# Patient Record
Sex: Female | Born: 1945 | Race: White | Hispanic: No | Marital: Married | Smoking: Never smoker
Health system: Southern US, Community
[De-identification: ages and names within clinical notes are randomized; demographics above are authoritative.]

## PROBLEM LIST (undated history)

## (undated) DIAGNOSIS — I10 Essential (primary) hypertension: Secondary | ICD-10-CM

## (undated) DIAGNOSIS — E079 Disorder of thyroid, unspecified: Secondary | ICD-10-CM

---

## 2001-01-17 ENCOUNTER — Encounter: Admission: RE | Admit: 2001-01-17 | Discharge: 2001-01-17 | Payer: Self-pay | Admitting: Family Medicine

## 2001-01-17 ENCOUNTER — Encounter: Payer: Self-pay | Admitting: Family Medicine

## 2001-02-01 ENCOUNTER — Encounter: Payer: Self-pay | Admitting: Family Medicine

## 2001-02-01 ENCOUNTER — Encounter: Admission: RE | Admit: 2001-02-01 | Discharge: 2001-02-01 | Payer: Self-pay | Admitting: Family Medicine

## 2003-04-23 ENCOUNTER — Encounter: Admission: RE | Admit: 2003-04-23 | Discharge: 2003-04-23 | Payer: Self-pay | Admitting: Family Medicine

## 2003-04-23 ENCOUNTER — Encounter: Payer: Self-pay | Admitting: Family Medicine

## 2005-05-24 ENCOUNTER — Ambulatory Visit: Payer: Self-pay | Admitting: Infectious Diseases

## 2005-05-25 ENCOUNTER — Ambulatory Visit: Payer: Self-pay | Admitting: Infectious Diseases

## 2005-05-25 ENCOUNTER — Ambulatory Visit (HOSPITAL_COMMUNITY): Admission: RE | Admit: 2005-05-25 | Discharge: 2005-05-25 | Payer: Self-pay | Admitting: Infectious Diseases

## 2005-05-30 ENCOUNTER — Ambulatory Visit: Payer: Self-pay | Admitting: Infectious Diseases

## 2005-06-01 ENCOUNTER — Ambulatory Visit: Payer: Self-pay | Admitting: Infectious Diseases

## 2005-06-05 ENCOUNTER — Ambulatory Visit: Payer: Self-pay | Admitting: Infectious Diseases

## 2005-06-06 ENCOUNTER — Ambulatory Visit: Payer: Self-pay | Admitting: Internal Medicine

## 2005-06-06 ENCOUNTER — Inpatient Hospital Stay (HOSPITAL_COMMUNITY): Admission: EM | Admit: 2005-06-06 | Discharge: 2005-06-13 | Payer: Self-pay | Admitting: Emergency Medicine

## 2005-06-07 ENCOUNTER — Ambulatory Visit: Payer: Self-pay | Admitting: Oncology

## 2005-06-09 ENCOUNTER — Encounter (INDEPENDENT_AMBULATORY_CARE_PROVIDER_SITE_OTHER): Payer: Self-pay | Admitting: Specialist

## 2005-06-30 ENCOUNTER — Encounter: Admission: RE | Admit: 2005-06-30 | Discharge: 2005-06-30 | Payer: Self-pay | Admitting: Gastroenterology

## 2008-04-24 ENCOUNTER — Inpatient Hospital Stay (HOSPITAL_COMMUNITY): Admission: AD | Admit: 2008-04-24 | Discharge: 2008-04-27 | Payer: Self-pay | Admitting: Family Medicine

## 2009-11-05 ENCOUNTER — Encounter: Admission: RE | Admit: 2009-11-05 | Discharge: 2009-12-08 | Payer: Self-pay | Admitting: Rheumatology

## 2010-08-24 ENCOUNTER — Encounter
Admission: RE | Admit: 2010-08-24 | Discharge: 2010-08-24 | Payer: Self-pay | Source: Home / Self Care | Attending: Family Medicine | Admitting: Family Medicine

## 2010-12-27 NOTE — Discharge Summary (Signed)
[name], [name]                ACCOUNT NO.:  [id number]   MEDICAL RECORD NO.:  [id number]          PATIENT TYPE:  INP   LOCATION:  1408                         FACILITY:  [facility]   PHYSICIAN:  Kela Millin, M.D.DATE OF BIRTH:  1945-12-11   DATE OF ADMISSION:  04/24/2008  DATE OF DISCHARGE:                               DISCHARGE SUMMARY   DISCHARGE DIAGNOSES:  1. Gastroenteritis - likely viral, stool cultures negative.  2. Urinary tract infection.  3. Sepsis syndrome - secondary to above, clinically improved.  4. Hypokalemia - potassium replaced.  5. History of polymyalgia rheumatica.  6. History of hypertension.  7. History of hypothyroidism.  8. History of dyslipidemia.  9. History of seasonal allergies.  10.History of hypermetabolic lymph nodes - per PET scan in the past      with outpatient follow-up.  11.Anemia - hemodilution a contributing factor, no evidence of      gastrointestinal bleeding.  The patient to follow up with primary      care physician for recheck of hemoglobin outpatient and further      evaluation.  12.Acute renal failure - resolved.   BRIEF HISTORY:  The patient is a 65 year old white female with the above-  listed medical problems who presented with complaints of diarrhea with  nausea and vomiting and was also found to be orthostatic at the primary  care physician's office.  She reported that she had been having multiple  episodes of nonbloody, watery diarrhea for 3 days and also had been  having nausea and vomiting.  She admitted to subjective fevers.  She  denied melena and no hematochezia.  She was initially seen at her  primary care physician's office and was found to be orthostatic and so  was directly admitted to the hospital for further evaluation and  management.  Upon arrival to the hospital, the patient was found to be  hypotensive with a blood pressure of 79/47 initially.  Please see the  full dictated admission history and physical  for the details of the  admission physical exam as well as the laboratory data.   HOSPITAL COURSE:  1. Gastroenteritis - as discussed above, upon admission, the patient      was bolused with IV fluids and started on sips of clears.  Stool      studies were obtained.  She was empirically started on Florastor      and Questran.  The stool studies came back negative for clostridium      difficile x3, also otherwise no gross in the stool cultures.  With      these interventions, the patient's symptoms gradually began to      resolve.  Her blood pressures improved, nausea and vomiting      resolved, and her diet was advanced to solids.  She has been      tolerating her diet well.  She is hemodynamically stable and      afebrile.  She is anxious to go home, and with the above clinical      improvement including that her stools are more formed,  she will be      discharged home to follow up with her primary care physician.  2. Urinary tract infection - the patient had a urinalysis done upon      admission which was consistent with a UTI.  Urine cultures were      ordered, but unfortunately sample was only obtained after the      patient had been on antibiotics for a few days, and the results are      still pending at this time.  The patient has defervesced.  She had      a leukocytosis that peaked at 17 while in the hospital but has      improved significantly; her white cell count today prior to      discharge is 12.2.  She is hemodynamically stable and will be      discharged home on oral antibiotics.  3. Sepsis syndrome - secondary to above.  Blood cultures were also      obtained while the patient was in the hospital and no growth to      date.  With the management discussed above, the patient's      hypotension resolved; she also defervesced and is clinically      improved.  4. Acute renal failure - pre renal; the patient's creatinine on      admission was elevated as 4.53.  She was  bolused with IV fluids and      adequately hydrated during her hospital stay.  With this as well as      treatment of her urine infection, her hypotension resolved.  Also,      her renal failure resolved.  Her last creatinine today prior to      discharge is 0.99 with a BUN of 5.  5. Anemia - the patient's hemoglobin upon admission was noted to be      10.3 with a hematocrit of 30.6, and with hydration, it came down to      8.4.  The patient has been asymptomatic without any evidence of GI      bleeding.  She states that she had a colonoscopy 5 years ago, with      no abnormalities reported.  She has been instructed to follow up      with her primary care physician for recheck of her hemoglobin and      referral to outpatient GI as appropriate pending repeat labs.  6. Hypertension - because the patient was hypotensive on admission,      her antihypertensives were held.  The hypotension has resolved at      this time, and the patient is to resume her blood pressure      medications as directed.  7. History of PMR - the patient was maintained on prednisone during      her hospital stay; she is to continue her outpatient medications      upon discharge.  8. Hypothyroidism - she was maintained on her levothyroxine during her      hospital stay.   DISCHARGE MEDICATIONS:  1. Ceftin 500 mg p.o. b.i.d. for 1 more week.  2. Florastor 250 mg p.o. b.i.d. for 2 more weeks.  3. Imodium p.r.n.  4. The patient to continue methotrexate, Zocor, Synthroid, folic acid,      prednisone, calcium plus D as previously.  5. Benicar as directed.   FOLLOW-UP CARE:  Dr. Catha Gosselin in 1 week, call for appointment.  To  have  recheck CBC upon follow-up.   DISCHARGE CONDITION:  Improved/stable.      Kela Millin, M.D.  Electronically Signed     ACV/MEDQ  D:  04/27/2008  T:  04/27/2008  Job:  [id number]   cc:   Caryn Bee L. Little, M.D.  Fax: [phone]

## 2010-12-27 NOTE — H&P (Signed)
[name], [name]                ACCOUNT NO.:  [id number]   MEDICAL RECORD NO.:  [id number]          PATIENT TYPE:  INP   LOCATION:  1408                         FACILITY:  [facility]   PHYSICIAN:  Kela Millin, M.D.DATE OF BIRTH:  [birth year]   DATE OF ADMISSION:  04/24/2008  DATE OF DISCHARGE:                              HISTORY & PHYSICAL   PRIMARY CARE PHYSICIANS:  Dwana Curd. Para March, M.D./Kevin L. Little, M.D.   CHIEF COMPLAINT:  Diarrhea with nausea and vomiting as per primary care  physician, orthostatic at the office.   HISTORY OF PRESENT ILLNESS:  The patient is a 65 year old white female  with past medical history significant for polymyalgia rheumatica, on  chronic prednisone, hypertension, hypothyroidism, dyslipidemia, who  presents with the above complaints.  She states that she has been having  diarrhea x3 days, multiple episodes of watery, nonbloody stools.  She  admits to associated nausea and vomiting 3-4 times a day, also  nonbloody.  She admits to subjective fevers.  She states that she had  grilled chicken from [facility] on Tuesday prior to the onset of the diarrhea  and so did other members of her family, but she is the only one that has  this diarrheal illness.  No recent antibiotic use reported.  She denies  abdominal pain, dysuria, cough, chest pain, shortness of breath, melena,  and no hematochezia.   She went to her primary care physician's office today and was found to  be orthostatic with a systolic blood pressure in the 90s and was  directly admitted for further evaluation and management.  She states  that she did not take any of her antihypertensives today because she  felt like she might throw them up.   PAST MEDICAL HISTORY:  1. As above.  2. History of seasonal allergies.  3. History of hypermetabolic lymph nodes in the past followed by her      outpatient physicians.  4. History of leukocytosis.  5. History of seasonal allergies.   MEDICATIONS:  1. Benicar 20 mg daily.  2. Methotrexate 25 mg/mL subcu on Thursdays.  3. Simvastatin 40 mg daily.  4. Omega-3/fish oil 2 tablets b.i.d.  5. Synthroid 125 mcg daily.  6. Folic acid 1 mg daily.  7. Prednisone 5 mg q.a.m.  8. Calcium 600 plus D 2-1/2 tablets daily.   ALLERGIES:  No known drug allergies.   SOCIAL HISTORY:  She denies tobacco.  She also denies alcohol.   FAMILY HISTORY:  Her mother had diabetes.   REVIEW OF SYSTEMS:  As per HPI, otherwise negative.   PHYSICAL EXAM:  GENERAL:  The patient is a pleasant, obese, older white  female.  She is alert and oriented, in no apparent distress.  VITAL SIGNS:  Temperature 102.7 with a pulse of 104 initially and 90 on  recheck.  Blood pressure initially 79/47 and 86/54 on recheck, O2 sat  97% on room air.  HEENT:  PERRL, EOMI, sclerae anicteric, dry mucous membranes and no oral  exudates.  NECK:  Supple, no adenopathy, no thyromegaly and no JVD.  LUNGS:  Decreased  breath sounds at the bases, otherwise clear to  auscultation.  CARDIOVASCULAR:  Regular rate and rhythm.  Normal S1 and S2.  ABDOMEN:  Obese, soft, bowel sounds present, nontender, nondistended.  No organomegaly and no masses palpable.  EXTREMITIES:  No cyanosis and no edema.  NEUROLOGIC:  Alert and oriented x3.  Cranial nerves II-XII grossly  intact.  Nonfocal exam.   LABORATORY DATA:  The patient directly admitted and labs ordered are  pending at the time of this dictation.   ASSESSMENT AND PLAN:  1. Probable gastroenteritis:  Bacterial versus viral in this patient      presenting with nausea, vomiting and diarrhea.  Will obtain stool      cultures, supportive care.  Also, as noted, patient febrile.  We      will obtain blood cultures as well and follow.  2. Hypotension:  Likely secondary to hypokalemia due to #1.  Blood      cultures, stool studies as above.  Also EKG, H&H, follow and      further manage as appropriate pending the studies.  Volume       resuscitation as above and follow.  3. Polymyalgia rheumatica:  On chronic prednisone and methotrexate.      Give stress dose of hydrocortisone, follow and further manage as      appropriate pending clinical course.  4. History of hypertension:  Holding all antihypertensives at this      time.  5. Hypothyroidism:  Continue outpatient medications.  6. Dyslipidemia:  Continue outpatient medications.      Kela Millin, M.D.  Electronically Signed     ACV/MEDQ  D:  04/24/2008  T:  04/24/2008  Job:  [id number]   cc:   Dwana Curd. Para March, M.D.  Fax: [phone]   Anna Genre. Little, M.D.  Fax: [phone]

## 2010-12-30 NOTE — Consult Note (Signed)
[name], [name]                ACCOUNT NO.:  [id number]   MEDICAL RECORD NO.:  [id number]          PATIENT TYPE:  INP   LOCATION:  1605                         FACILITY:  [facility]   PHYSICIAN:  Wilmon Arms. Corliss Skains, M.D. DATE OF BIRTH:  [birth year]   DATE OF CONSULTATION:  06/10/2005  DATE OF DISCHARGE:                                   CONSULTATION   CONSULTING PHYSICIAN:  Dr. Virginia Rochester   REASON FOR CONSULTATION:  Possible lymph node biopsy.   [name] is a 65 year old female with previously good health, who presents  with 5 week history of intermittent fevers.  These fevers tend to occur at  night and have gone as high as 102.5.  She associated these fevers with  night sweats.  She has also had myalgias and arthralgias.  She describes  increased fatigue.  The patient has undergone a very extensive work-up by  her primary care physician, Dr. Clarene Duke, as well as Dr. Roxan Hockey in  infectious disease.  She was admitted to the hospital on 23 October for  further work-up.  She has had 2 abdominal CT scans with contrast which  showed no evidence of intra-abdominal pathology.  She had no evidence of any  lymphadenopathy.  The patient did report some nausea, vomiting, and diarrhea  several days ago, but this has since resolved.  She continues to have fevers  which occur mostly at night.  On October 26, she underwent a full body FDG  PET scan.  This was significant for multiple moderately hypermetabolic lymph  nodes in the upper abdomen, predominantly located near the hepatoduodenal  ligament and in the portacaval space.  She has some retroperitoneal  periaortic lymph nodes.  There is no abnormal activity above the diaphragm.  The patient underwent a bone marrow biopsy on October 27, from the left  iliac crest.  The patient tolerated the procedure well.  The results are  pending.  The patient continues to have fevers.  This morning she is at  afebrile but at 2200 last night she had a  temperature of 102.3.   CURRENT MEDICATIONS:  Synthroid, Imodium, Rocephin which is being stopped,  p.r.n. Tylenol, p.r.n. Phenergan.   HOME MEDICATIONS:  Synthroid.  She formerly took Merchant navy officer, Tourist information centre manager, Careers adviser  but is no longer taking these.   ALLERGIES:  None.   PAST MEDICAL HISTORY:  1.  Dyslipidemia.  2.  Hypertension.  3.  Hypothyroidism.  4.  Seasonal allergies.   PAST SURGICAL HISTORY:  None.   FAMILY HISTORY:  Coronary artery disease.  No history of any malignancies in  the immediate family, including leukemias or lymphomas.   PHYSICAL EXAMINATION:  VITAL SIGNS:  Temperature 97.5, pulse 80,  respirations 18, blood pressure 90/50.  GENERAL:  This is a well-developed, well-nourished female, who looks  fatigued but is in no acute distress.  HEENT:  Extraocular movements are intact.  Sclerae show no icterus.  NECK:  No lymphadenopathy, supple.  HEART:  Regular rate and rhythm.  No murmurs.  LUNGS:  Clear to auscultation bilaterally.  Normal respiratory effort.  ABDOMEN:  Soft,  nontender, nondistended.  No masses are palpated.   LABORATORY DATA:  Her C. difficile stool tests have been negative.  The bone  marrow culture is negative.  Her white count on October 27 was 17.9,  hemoglobin 8.4, platelet count elevated at 575.  Electrolytes are within  normal limits.  Liver function tests are essentially normal.   IMPRESSION:  Fever of unknown origin with PET scan showing hypometabolic  lymph nodes in the upper abdomen.  CT scan shows no evidence of  lymphadenopathy.  Bone marrow biopsy pending.   RECOMMENDATIONS:  This is a perplexing case.  Certainly lymphoma would be  high on the differential diagnosis.  An intra-abdominal lymph node biopsy on  this patient would be moderately difficult due to the fact that she has no  enlarged lymph nodes.  We could attempt a laparoscopic examination of the  hepatoduodenal ligament to try to locate some lymph nodes.  She might  require a  small upper midline laparotomy to help [place] locate these lymph  nodes.  However, prior to scheduling or discussing any surgery, I would wait  for the bone marrow biopsy.  We will continue to follow.      Wilmon Arms. Tsuei, M.D.  Electronically Signed     MKT/MEDQ  D:  06/10/2005  T:  06/10/2005  Job:  [id number]   cc:   Hollice Espy, M.D.

## 2010-12-30 NOTE — Discharge Summary (Signed)
NAMEIVORI, STORR                ACCOUNT NO.:  [id number]   MEDICAL RECORD NO.:  [id number]          PATIENT TYPE:  INP   LOCATION:  1605                         FACILITY:  [facility]   PHYSICIAN:  Hollice Espy, M.D.DATE OF BIRTH:  1946/05/20   DATE OF ADMISSION:  06/06/2005  DATE OF DISCHARGE:  06/13/2005                                 DISCHARGE SUMMARY   CONSULTATIONS:  Rockey Situ. Roxan Hockey, M.D., Infectious Disease.  Wilmon Arms. Corliss Skains, M.D., [facility].   PRIMARY CARE PHYSICIAN:  Caryn Bee L. Little, M.D.   DISCHARGE DIAGNOSES:  1.  Fever of unknown origin, suspected possible bacterial infection and/or      allergic drug reaction.  2.  Leukocytosis, possibly from infection, not resolving.  3.  Dyslipidemia.  4.  Hypertension.  5.  Hypothyroidism.  6.  Seasonal allergies.   DISCHARGE MEDICATIONS:  1.  The patient is continuing her previous medications of Synthroid 100 mcg      p.o. daily.  2.  She previously had been on Crestor, Benicar and Allegra, but had stopped      these medications prior to her hospitalization.   HOSPITAL COURSE:  The patient is a 65 year old white female who 3-1/2 weeks  prior to admission saw her primary care physician complaining of fever.  At  that time, she showed evidence of a mild UTI, and she was given antibiotics.  However, fever has persisted along with myalgias and arthralgias.  She  continued to have fever every night, usually starting in the last afternoon,  and then the weekend two days prior to admission, she had a continuous  fever, day and night with a temperature of 102.  The patient was referred to  the ID Clinic and evaluated by Dr. Roxan Hockey.  ANA, chest x-ray and CT of the  abdomen and pelvis were all done which were negative.  Echocardiogram showed  benign systolic murmur.  The patient had a positive parvovirus for IgG titer  showing remote acquisition, and the patient had elevated SED rate of 113.  In addition, CMV,  cultures and CK levels were normal.  Blood cultures were  negative.  Had an EBV, was positive again for IgG titer which showed remote  infection.  The patient was stopped on all medications except for her  Synthroid 1-1/2 weeks prior to admission for this reason to rule out  medication as a cause.  The patient initially started having some nausea and  vomiting starting a week prior as well.  She was admitted on October 23, for  further hospitalization workup, due to the fact that her fevers began to  occur continuously.  Infectious Disease followed along with the hospitalists  in terms of further workup.  The patient was noted on admission to have an  elevated white count of 18.7 with 90% neutrophils.  She was noted to have a  tiny left pleural effusion and bilateral renal peripelvic cysts.  Repeat  blood cultures, urine cultures, UA was ordered.  Antibiotics were held  unless they were felt to be clinically indicated.  The patient was evaluated  in the sense to look for possible signs of infection that may have been  missed or a malignancy which can present in the form of fever initially.  The patient continued during her hospitalization to have elevated  temperatures.  Her white count increased on hospital day #2 from 18.7 to  27.4, 20% bandemia.  CRP was elevated at 26.3.  Her repeat UA was  unremarkable showing only 0-2 white cells, but many bacteria.  At this  point, there is a question she may be having a UTI as well.  The patient was  also complaining of some loose bowel movements.  The Infectious Disease  doctors felt that starting Rocephin would be necessary.  Although, they felt  that this may not be the cause of her persistent fevers, it still was sent  for C. difficile as well.  Dr. Darnelle Catalan from Hematology/Oncology was  consulted as well for further recommendations.  He felt, to look for  malignancy, a PET scan and bone marrow biopsy would be indicated.  By  hospital day #3,  the patient's white count had decreased to 23.7.  In  addition, her H&H had dropped as well from 9.7 down to 8.4.  Iron studies  were checked and found to be less than 10.  However, ferritin levels were  noted to be elevated, more consistent with a chronic disease state.  In  addition, other urine cultures, blood cultures continue to be negative.  Repeat C. difficile cultures, CT of the abdomen and pelvis were all  negative.  By October 26, the patient's white count continued to be improve  down to 19.9.  Repeat C. difficile cultures were sent as the patient  continued to have diarrhea.  The patient was on day 3 of ceftriaxone at this  point.  On day #4, the patient was noted to have mild tachycardia and an ABG  was checked on day #5.  Noted that her pH was only 61.  BNP was slightly  elevated at 435.  The patient had been continuing to receive IV fluid, and  she was felt to be mildly volume overloaded.  IV fluids were discontinued.  She was given a dose of Lasix, put out a significant amount of urine, and  her vitals, otherwise, were stable.  The patient underwent PET scan on  October 26.  Results showed multiple moderate hypermetabolic lymph nodes  present in the upper abdomen and retroperitoneal area.  Nonspecifically,  could indicate a possibility of lymphoma.  Other labs that returned back  were a negative P-ANCA lab.  In addition, the differential on hospital day  #4, the patient's white count showed 90%, but an absolute eosinophil count  of 1, markedly higher than normal.  On October 27, the patient underwent a  CT guided bone marrow biopsy, results were sent to lab for pathology.  Tolerated this well without any incident.  In addition, her ANA, ANCA and  myeloperoxidase stains were all negative.  The patient only grew out 70,000  colonies on her urine culture, but it was otherwise unremarkable.  She also grew out 10,000 colonies on previous urine culture on October 23, but again,   no where near significant.  The patient continued to have temperature  spikes, but otherwise, is doing well.  Her diarrhea improved with Imodium.  Her appetite improved as well.  Her second C. difficile cultures resent also  came back negative, and these were otherwise well.  After speaking with  general surgery about  the patient having possibility of lymph node biopsy,  given location of the lymph nodes and the fact that they were not enlarged,  surgery felt that a surgical lymph node biopsy may be difficult and a  laparoscopy surgical procedure could be attempted, but this may end up being  open procedure.  Given the not clear cut sign that this was a malignancy, it  was felt it might be better to wait for the full results of the bone marrow  biopsy.  By October 28, the patient was feeling better.  She was starting to  ambulate slowly.  She had had a recent cough.  Chest x-ray done on October  27 showed mild increased interstitial markings.  However, review of the  chest x-ray showed underexposed film, and likely this may have been  secondary to mild volume overload.  By October 29, the patient was feeling  better.  In review of her chest x-ray, Dr. Orvan Falconer felt that her  infiltrates were not accurate reading secondary to a poorly exposed film.  Repeat chest x-ray was ordered for October 30.  At this point, the patient  was ambulating well.  She continued to have fever spikes.  However, no other  complaints of appetites had completely returned back to normal.  Her  activity level was greatly improved with her being able to ambulate in the  hall well with her husband.  In addition, a repeat white count on October  30, showed a white count decreased down to 11.8, 79% neutrophils, but also a  marked eosinophil count of 9, and that is with an eosinophil count of 1.1.  At this point, the only pathology waiting to return back was patient's bone  marrow biopsy.  Preliminary reports from the bone  marrow biopsy showed no  evidence of any acid fast bacilli, fungal along with yeast elements, white  blood cells or organisms.  Although the final pathology does take several  weeks to come back, I discussed this with the patient and her husband  extensively, given the findings of her 10/30 chest x-ray which showed no  evidence of any edema and the fact that she was feeling much better and  despite the fact that her fevers were continuing, but her appetite and  activity levels were coming back to normal.  It was felt the patient was  medically stable to be discharged on October 31.  I told the patient that in  the end we may never find the exact cause of her fevers, especially if there  was some sort of bacterial infection component as evidenced by her elevated  white count with neutrophil count, but this seemed to be getting better, so  it may have been from a urinary tract infection that was partially treated, or maybe from a different source as well.  The other thing I explained to  the patient was that she may be having some sort of allergic reaction  according to some source given the higher eosinophil count, or perhaps this  may be more of a parasitic infection.  In regards to a parasitic infection,  would discuss this with Infectious disease in regards to exposure to either  an allergy to a drug or substance at work.  I advised the patient that  continued vigilance would do this.  I did not think that it came from the  Synthroid, but Crestor or Benicar may have been more of a possibility.  The  patient and her husband understood  and are amenable to plans of following  discharge; follow up with PCP, Catha Gosselin, as well as, Infectious disease;  follow up on the bone marrow biopsy results.  In regards to the patient's  noted hypermetabolic lymph nodes seen on PET scan, bone marrow biopsy  results were unremarkable.  Perhaps considering a repeat PET scan in 2-3  months or possibly a  lymph node biopsy may be more appropriate, but at this  time, they will consider that.   PLAN:  The patient's overall disposition is improved since her admission.  Her activity will be as tolerated given that she is able to ambulate well,  and she was not interested in Home Health PT.  I am comfortable with  discharging her home on activity of her own accord.  Although, I am going to  advise her that she rest after she returns home, and not to return to work  until June 19, 2005, where she works at a [employer].  The patient is  being discharged on a low sodium diet.   FOLLOWUP:  She will follow up with PCP, Dr. Catha Gosselin, in the next 1-2  weeks as well as Infectious Disease Clinic in the next 1-2 weeks.      Hollice Espy, M.D.  Electronically Signed     SKK/MEDQ  D:  06/12/2005  T:  06/12/2005  Job:  [id number]   cc:   Valentino Hue. Magrinat, M.D.  Fax: [phone]   Cliffton Asters, M.D.  Fax: [phone]   Anna Genre. Little, M.D.  Fax: [phone]   Wilmon Arms. Corliss Skains, M.D.  [address] [place] [address] [id number]  [place]

## 2010-12-30 NOTE — H&P (Signed)
[name], [name]                ACCOUNT NO.:  [id number]   MEDICAL RECORD NO.:  [id number]          PATIENT TYPE:  OBV   LOCATION:  0101                         FACILITY[facility]   PHYSICIAN:  Theone Stanley, MD   DATE OF BIRTH:  May 01, 1946   DATE OF ADMISSION:  06/05/2005  DATE OF DISCHARGE:                                HISTORY & PHYSICAL   ADDENDUM   PHYSICAL EXAMINATION:  I did see a little bit of white coat to her tongue.  However, there were no white patches on the side of her mouth. The patient  states that she has not noticed that.  BREAST:  A rudimentary breast exam was performed. I do not appreciate any  masses. There was typical breast tissue present. However, again, no discrete  masses.  LYMPHATIC:  Examination of the neck and axillary and groin area did not  reveal any enlarged lymph nodes.   ASSESSMENT AND PLAN:  Fever of unknown origin. In regards to the possibility  of underlying carcinoma, the patient states that she gets her breast exam by  Dr. Clarene Duke and has had a mammogram a year-and-a-half ago. She has noticed  anything different about her breasts overall. In addition, the patient had a  colonoscopy by Dr. Evette Cristal about 5 years ago and she stated that there was  nothing there and she was reported to come back in about 10 years from that  time. Will check a TSH level, LDH, and LFTs.      Theone Stanley, MD  Electronically Signed     AEJ/MEDQ  D:  06/05/2005  T:  06/05/2005  Job:  [id number]   cc:   Rockey Situ. Flavia Shipper., M.D.  Fax: [phone]   Anna Genre. Little, M.D.  Fax: [phone]

## 2010-12-30 NOTE — H&P (Signed)
NAME:  [name], [name]                ACCOUNT NO.:  [id number]   MEDICAL RECORD NO.:  [id number]          PATIENT TYPE:  OBV   LOCATION:  0101                         [facility]ACILITY:  [facility]   PHYSICIAN:  Theone Stanley, MD   DATE OF BIRTH:  1946/03/26   DATE OF ADMISSION:  06/05/2005  DATE OF DISCHARGE:                                HISTORY & PHYSICAL   Information was received from the patient and notes from infectious disease  clinic.   [name] is a very pleasant 65 year old female, who presented to her  primary care physician, Dr. Catha Gosselin for fever approximately 3-1/2 weeks  ago.  At that time, there was evidence of a mild urinary tract infection.  She was given antibiotics; however, this did not resolve.  She continued to  have fevers with myalgias and arthralgias.  She states that she has a fever  every night, approximately starting at 3 p.m.  However, this weekend, the  21st and 22nd, she stated that she had continuous fever.  The highest it had  gone up to was 102.1.  She was referred to the infectious disease outpatient  clinic and saw Dr. Roxan Hockey.  At that point in time, further studies were  performed.  ANA was negative.  Parvovirus was positive; however, it was  positive for IgG, indicating remote acquisition.  Sed rate was elevated at  113.  She had an echocardiogram which showed a benign systolic murmur and no  evidence of endocarditis.  Chest x-ray was clear.  A CT of the abdomen and  pelvis did not show any evidence or source of infection.  In addition, she  has had RA which is less than 20, CMV which is negative, EEV was positive  for IgG, however, indicating a remote infection.  Total CK was normal range.  Blood cultures were negative.  The patient was advised to stop all  medications except Synthroid.  She stopped her medications about a week and  a half ago.  The patient recently started to experience nausea and vomiting,  starting last week on Wednesday.  She  cannot keep anything down.  She has  constant nausea.  She denies being around any sick people.  The patient  states that she has noticed increased abdominal girth, despite the fact that  she has not been eating.   PAST MEDICAL HISTORY:  1.  Dyslipidemia.  2.  Hypertension.  3.  Hypothyroidism.  4.  Seasonal allergies.   MEDICATIONS:  She is currently only on Synthroid 0.1 mg daily.  She no  longer takes Crestor, Benicar, or Allegra.   ALLERGIES:  None.   FAMILY HISTORY:  Significant for heart disease.  Otherwise, the patient  denies any leukemias, lymphomas, or any other blood dyscrasias in the family  or cancers.   SOCIAL HISTORY:  The patient lives here in [place], is married, has 1  child, is monogamous.  She has 1 pet which goes both in and outside.  No  recent travel or camping.   REVIEW OF SYSTEMS:  Please see HPI.  Otherwise, all systems are  negative.   PHYSICAL EXAMINATION:  VITAL SIGNS:  Temperature 98.7, heart rate 81, blood  pressure 143/77, respiratory rate of 18, saturating 100% on room air.  HEENT:  Head atraumatic, normocephalic.  Eyes:  3 mm, pupils equal,  reactive.  Oropharynx clear.  NECK:  Supple, no lymphadenopathy.  HEART:  Regular rate and rhythm.  No murmurs, rubs, or gallops appreciated.  LUNGS:  Clear to auscultation bilaterally.  ABDOMEN:  Soft, nontender, nondistended.  No hepatosplenomegaly appreciated.  EXTREMITIES:  No edema, cyanosis, or clubbing.  SKIN:  There were no rashes appreciated.  NEUROLOGIC:  The patient alert and oriented x 3, 5/5 strength in upper and  lower extremities.  Cranial nerves 2-12 grossly intact.  GU:  Deferred.   LABORATORY DATA:  CBC, blood cultures, and BMET are pending.   ASSESSMENT/PLAN:  1.  Fever of unknown origin.  The exact etiology at this point in time is      unclear.  Infectious process has been extensively explored by Dr.      Roxan Hockey.  I will leave it up to him to decide whether further testing       is needed.  I am not sure whether a TB PPD has been performed.  This      might be something worthwhile.  With the elevated, ESR, we will      definitely have to consider a rheumatologic.  The patient has a normal      CK, has had no evidence of any headaches or blurred vision, however,      need to keep in mind her age and the possibility of giant cell      arthritis.  At this point in time, it is very concerning also for an      underlying cancer.  I will obtain a CT scan of the head to see if there      might be any intracranial process occurring.  Since a CAT scan of the      abdomen and chest are negative, the next step would be a PET scan to see      if there might be something that would show up.  Infectious disease has      been consulted.  2.  Hypothyroidism.  We will continue her Synthroid and check a TSH.      Theone Stanley, MD  Electronically Signed     AEJ/MEDQ  D:  06/05/2005  T:  06/05/2005  Job:  [id number]   cc:   Rockey Situ. Flavia Shipper., M.D.  Fax: [phone]

## 2011-05-17 LAB — CLOSTRIDIUM DIFFICILE EIA
C difficile Toxins A+B, EIA: NEGATIVE
C difficile Toxins A+B, EIA: NEGATIVE
C difficile Toxins A+B, EIA: NEGATIVE

## 2011-05-17 LAB — BASIC METABOLIC PANEL
BUN: 12
BUN: 31 — ABNORMAL HIGH
BUN: 5 — ABNORMAL LOW
CO2: 23
Calcium: 7 — ABNORMAL LOW
Calcium: 7.4 — ABNORMAL LOW
Chloride: 110
Creatinine, Ser: 0.99
Creatinine, Ser: 2.47 — ABNORMAL HIGH
GFR calc Af Amer: 24 — ABNORMAL LOW
GFR calc non Af Amer: 20 — ABNORMAL LOW
GFR calc non Af Amer: 42 — ABNORMAL LOW
GFR calc non Af Amer: 57 — ABNORMAL LOW
Glucose, Bld: 110 — ABNORMAL HIGH
Glucose, Bld: 148 — ABNORMAL HIGH
Potassium: 3 — ABNORMAL LOW
Potassium: 3.1 — ABNORMAL LOW
Sodium: 141

## 2011-05-17 LAB — CULTURE, BLOOD (ROUTINE X 2)
Culture: NO GROWTH
Culture: NO GROWTH

## 2011-05-17 LAB — URINALYSIS, ROUTINE W REFLEX MICROSCOPIC
Bilirubin Urine: NEGATIVE
Glucose, UA: NEGATIVE
Ketones, ur: NEGATIVE
Nitrite: NEGATIVE
Protein, ur: 100 — AB
Specific Gravity, Urine: 1.01
Urobilinogen, UA: 0.2
pH: 6

## 2011-05-17 LAB — COMPREHENSIVE METABOLIC PANEL
ALT: 20
AST: 19
Albumin: 3 — ABNORMAL LOW
Alkaline Phosphatase: 55
BUN: 43 — ABNORMAL HIGH
CO2: 22
Calcium: 7.8 — ABNORMAL LOW
Chloride: 101
Creatinine, Ser: 4.53 — ABNORMAL HIGH
GFR calc Af Amer: 12 — ABNORMAL LOW
GFR calc non Af Amer: 10 — ABNORMAL LOW
Glucose, Bld: 181 — ABNORMAL HIGH
Potassium: 3.3 — ABNORMAL LOW
Sodium: 135
Total Bilirubin: 0.7
Total Protein: 6.1

## 2011-05-17 LAB — DIFFERENTIAL
Basophils Absolute: 0
Basophils Absolute: 0
Basophils Relative: 0
Eosinophils Absolute: 0.2
Eosinophils Relative: 1
Lymphocytes Relative: 3 — ABNORMAL LOW
Lymphocytes Relative: 6 — ABNORMAL LOW
Lymphs Abs: 0.5 — ABNORMAL LOW
Monocytes Absolute: 0.8
Monocytes Relative: 5
Neutro Abs: 10 — ABNORMAL HIGH
Neutro Abs: 14.9 — ABNORMAL HIGH
Neutrophils Relative %: 91 — ABNORMAL HIGH

## 2011-05-17 LAB — CBC
HCT: 26.3 — ABNORMAL LOW
HCT: 26.9 — ABNORMAL LOW
HCT: 30.6 — ABNORMAL LOW
Hemoglobin: 10.3 — ABNORMAL LOW
Hemoglobin: 9.1 — ABNORMAL LOW
MCHC: 33.5
MCHC: 33.8
MCV: 96.9
MCV: 97.2
Platelets: 272
Platelets: 283
Platelets: 287
Platelets: 302
RBC: 2.78 — ABNORMAL LOW
RBC: 3.15 — ABNORMAL LOW
RDW: 17.4 — ABNORMAL HIGH
RDW: 17.6 — ABNORMAL HIGH
RDW: 18.3 — ABNORMAL HIGH
WBC: 15 — ABNORMAL HIGH
WBC: 16.4 — ABNORMAL HIGH
WBC: 17 — ABNORMAL HIGH

## 2011-05-17 LAB — STOOL CULTURE

## 2011-05-17 LAB — URINE CULTURE
Colony Count: NO GROWTH
Culture: NO GROWTH

## 2011-05-17 LAB — OVA AND PARASITE EXAMINATION: Ova and parasites: NONE SEEN

## 2011-05-17 LAB — FECAL LACTOFERRIN, QUANT: Fecal Lactoferrin: POSITIVE

## 2011-05-17 LAB — URINE MICROSCOPIC-ADD ON

## 2011-08-18 ENCOUNTER — Other Ambulatory Visit: Payer: Self-pay | Admitting: Family Medicine

## 2011-08-18 DIAGNOSIS — Z1231 Encounter for screening mammogram for malignant neoplasm of breast: Secondary | ICD-10-CM

## 2011-11-27 ENCOUNTER — Ambulatory Visit: Payer: Self-pay

## 2011-12-20 ENCOUNTER — Ambulatory Visit
Admission: RE | Admit: 2011-12-20 | Discharge: 2011-12-20 | Disposition: A | Discharge status: Home / Self Care | Payer: 59 | Source: Ambulatory Visit | Attending: Family Medicine | Admitting: Family Medicine

## 2011-12-20 DIAGNOSIS — Z1231 Encounter for screening mammogram for malignant neoplasm of breast: Secondary | ICD-10-CM

## 2012-11-18 ENCOUNTER — Other Ambulatory Visit: Payer: Self-pay

## 2012-11-18 DIAGNOSIS — Z1231 Encounter for screening mammogram for malignant neoplasm of breast: Secondary | ICD-10-CM

## 2012-12-24 ENCOUNTER — Ambulatory Visit
Admission: RE | Admit: 2012-12-24 | Discharge: 2012-12-24 | Disposition: A | Discharge status: Home / Self Care | Payer: Medicare Other | Source: Ambulatory Visit

## 2012-12-24 DIAGNOSIS — Z1231 Encounter for screening mammogram for malignant neoplasm of breast: Secondary | ICD-10-CM

## 2013-12-12 ENCOUNTER — Other Ambulatory Visit: Payer: Self-pay

## 2013-12-12 DIAGNOSIS — Z1231 Encounter for screening mammogram for malignant neoplasm of breast: Secondary | ICD-10-CM

## 2013-12-31 ENCOUNTER — Other Ambulatory Visit: Payer: Self-pay | Admitting: Family Medicine

## 2013-12-31 DIAGNOSIS — M8580 Other specified disorders of bone density and structure, unspecified site: Secondary | ICD-10-CM

## 2014-01-02 ENCOUNTER — Encounter (INDEPENDENT_AMBULATORY_CARE_PROVIDER_SITE_OTHER): Payer: Self-pay

## 2014-01-02 ENCOUNTER — Ambulatory Visit
Admission: RE | Admit: 2014-01-02 | Discharge: 2014-01-02 | Disposition: A | Discharge status: Home / Self Care | Payer: Commercial Managed Care - HMO | Source: Ambulatory Visit

## 2014-01-02 DIAGNOSIS — Z1231 Encounter for screening mammogram for malignant neoplasm of breast: Secondary | ICD-10-CM

## 2014-02-09 ENCOUNTER — Other Ambulatory Visit: Payer: Self-pay | Admitting: Gastroenterology

## 2014-03-26 ENCOUNTER — Other Ambulatory Visit: Payer: Commercial Managed Care - HMO

## 2014-03-31 ENCOUNTER — Other Ambulatory Visit: Payer: Commercial Managed Care - HMO

## 2014-03-31 ENCOUNTER — Ambulatory Visit
Admission: RE | Admit: 2014-03-31 | Discharge: 2014-03-31 | Disposition: A | Discharge status: Home / Self Care | Payer: Commercial Managed Care - HMO | Source: Ambulatory Visit | Attending: Family Medicine | Admitting: Family Medicine

## 2014-03-31 DIAGNOSIS — M8580 Other specified disorders of bone density and structure, unspecified site: Secondary | ICD-10-CM

## 2014-12-04 ENCOUNTER — Other Ambulatory Visit: Payer: Self-pay | Admitting: Family Medicine

## 2014-12-04 DIAGNOSIS — Z7952 Long term (current) use of systemic steroids: Secondary | ICD-10-CM

## 2014-12-04 DIAGNOSIS — M8580 Other specified disorders of bone density and structure, unspecified site: Secondary | ICD-10-CM

## 2014-12-04 DIAGNOSIS — Z1231 Encounter for screening mammogram for malignant neoplasm of breast: Secondary | ICD-10-CM

## 2015-04-02 ENCOUNTER — Other Ambulatory Visit: Payer: Commercial Managed Care - HMO

## 2015-04-02 ENCOUNTER — Ambulatory Visit
Admission: RE | Admit: 2015-04-02 | Discharge: 2015-04-02 | Disposition: A | Discharge status: Home / Self Care | Payer: Medicare Other | Source: Ambulatory Visit | Attending: Family Medicine | Admitting: Family Medicine

## 2015-04-02 DIAGNOSIS — Z7952 Long term (current) use of systemic steroids: Secondary | ICD-10-CM

## 2015-04-02 DIAGNOSIS — M8580 Other specified disorders of bone density and structure, unspecified site: Secondary | ICD-10-CM

## 2015-04-02 DIAGNOSIS — Z1231 Encounter for screening mammogram for malignant neoplasm of breast: Secondary | ICD-10-CM

## 2016-03-02 ENCOUNTER — Other Ambulatory Visit: Payer: Self-pay | Admitting: Family Medicine

## 2016-03-02 DIAGNOSIS — M8580 Other specified disorders of bone density and structure, unspecified site: Secondary | ICD-10-CM

## 2016-03-02 DIAGNOSIS — Z1231 Encounter for screening mammogram for malignant neoplasm of breast: Secondary | ICD-10-CM

## 2016-04-03 ENCOUNTER — Ambulatory Visit
Admission: RE | Admit: 2016-04-03 | Discharge: 2016-04-03 | Disposition: A | Discharge status: Home / Self Care | Payer: Medicare Other | Source: Ambulatory Visit | Attending: Family Medicine | Admitting: Family Medicine

## 2016-04-03 DIAGNOSIS — Z1231 Encounter for screening mammogram for malignant neoplasm of breast: Secondary | ICD-10-CM

## 2016-04-03 DIAGNOSIS — M8580 Other specified disorders of bone density and structure, unspecified site: Secondary | ICD-10-CM

## 2016-04-05 ENCOUNTER — Other Ambulatory Visit: Payer: Self-pay | Admitting: Family Medicine

## 2016-04-05 DIAGNOSIS — R928 Other abnormal and inconclusive findings on diagnostic imaging of breast: Secondary | ICD-10-CM

## 2016-04-10 ENCOUNTER — Ambulatory Visit
Admission: RE | Admit: 2016-04-10 | Discharge: 2016-04-10 | Disposition: A | Discharge status: Home / Self Care | Payer: Medicare Other | Source: Ambulatory Visit | Attending: Family Medicine | Admitting: Family Medicine

## 2016-04-10 DIAGNOSIS — R928 Other abnormal and inconclusive findings on diagnostic imaging of breast: Secondary | ICD-10-CM

## 2016-09-14 ENCOUNTER — Other Ambulatory Visit: Payer: Self-pay | Admitting: Family Medicine

## 2016-09-14 DIAGNOSIS — R921 Mammographic calcification found on diagnostic imaging of breast: Secondary | ICD-10-CM

## 2016-10-12 ENCOUNTER — Ambulatory Visit
Admission: RE | Admit: 2016-10-12 | Discharge: 2016-10-12 | Disposition: A | Discharge status: Home / Self Care | Payer: Medicare Other | Source: Ambulatory Visit | Attending: Family Medicine | Admitting: Family Medicine

## 2016-10-12 DIAGNOSIS — R921 Mammographic calcification found on diagnostic imaging of breast: Secondary | ICD-10-CM

## 2017-03-06 ENCOUNTER — Other Ambulatory Visit: Payer: Self-pay | Admitting: Family Medicine

## 2017-03-06 DIAGNOSIS — M8580 Other specified disorders of bone density and structure, unspecified site: Secondary | ICD-10-CM

## 2017-03-20 ENCOUNTER — Other Ambulatory Visit: Payer: Self-pay | Admitting: Family Medicine

## 2017-03-20 DIAGNOSIS — R921 Mammographic calcification found on diagnostic imaging of breast: Secondary | ICD-10-CM

## 2017-04-17 ENCOUNTER — Ambulatory Visit
Admission: RE | Admit: 2017-04-17 | Discharge: 2017-04-17 | Disposition: A | Discharge status: Home / Self Care | Payer: Medicare Other | Source: Ambulatory Visit | Attending: Family Medicine | Admitting: Family Medicine

## 2017-04-17 DIAGNOSIS — R921 Mammographic calcification found on diagnostic imaging of breast: Secondary | ICD-10-CM

## 2017-04-17 DIAGNOSIS — M8580 Other specified disorders of bone density and structure, unspecified site: Secondary | ICD-10-CM

## 2017-09-20 DIAGNOSIS — M150 Primary generalized (osteo)arthritis: Secondary | ICD-10-CM | POA: Diagnosis not present

## 2017-09-20 DIAGNOSIS — M353 Polymyalgia rheumatica: Secondary | ICD-10-CM | POA: Diagnosis not present

## 2017-09-20 DIAGNOSIS — M8589 Other specified disorders of bone density and structure, multiple sites: Secondary | ICD-10-CM | POA: Diagnosis not present

## 2017-09-20 DIAGNOSIS — Z79899 Other long term (current) drug therapy: Secondary | ICD-10-CM | POA: Diagnosis not present

## 2017-11-06 DIAGNOSIS — H40053 Ocular hypertension, bilateral: Secondary | ICD-10-CM | POA: Diagnosis not present

## 2017-11-06 DIAGNOSIS — H18893 Other specified disorders of cornea, bilateral: Secondary | ICD-10-CM | POA: Diagnosis not present

## 2017-11-06 DIAGNOSIS — H40013 Open angle with borderline findings, low risk, bilateral: Secondary | ICD-10-CM | POA: Diagnosis not present

## 2017-11-13 DIAGNOSIS — R69 Illness, unspecified: Secondary | ICD-10-CM | POA: Diagnosis not present

## 2018-04-04 DIAGNOSIS — M150 Primary generalized (osteo)arthritis: Secondary | ICD-10-CM | POA: Diagnosis not present

## 2018-04-04 DIAGNOSIS — M8589 Other specified disorders of bone density and structure, multiple sites: Secondary | ICD-10-CM | POA: Diagnosis not present

## 2018-04-04 DIAGNOSIS — Z79899 Other long term (current) drug therapy: Secondary | ICD-10-CM | POA: Diagnosis not present

## 2018-04-04 DIAGNOSIS — M353 Polymyalgia rheumatica: Secondary | ICD-10-CM | POA: Diagnosis not present

## 2018-04-16 ENCOUNTER — Other Ambulatory Visit: Payer: Self-pay | Admitting: Family Medicine

## 2018-04-16 DIAGNOSIS — Z7952 Long term (current) use of systemic steroids: Secondary | ICD-10-CM

## 2018-04-16 DIAGNOSIS — M8580 Other specified disorders of bone density and structure, unspecified site: Secondary | ICD-10-CM

## 2018-04-16 DIAGNOSIS — R921 Mammographic calcification found on diagnostic imaging of breast: Secondary | ICD-10-CM

## 2018-05-20 DIAGNOSIS — M5030 Other cervical disc degeneration, unspecified cervical region: Secondary | ICD-10-CM | POA: Diagnosis not present

## 2018-05-20 DIAGNOSIS — Z820 Family history of epilepsy and other diseases of the nervous system: Secondary | ICD-10-CM | POA: Diagnosis not present

## 2018-05-20 DIAGNOSIS — R69 Illness, unspecified: Secondary | ICD-10-CM | POA: Diagnosis not present

## 2018-05-30 DIAGNOSIS — M353 Polymyalgia rheumatica: Secondary | ICD-10-CM | POA: Diagnosis not present

## 2018-05-30 DIAGNOSIS — Z8669 Personal history of other diseases of the nervous system and sense organs: Secondary | ICD-10-CM | POA: Diagnosis not present

## 2018-05-30 DIAGNOSIS — G4733 Obstructive sleep apnea (adult) (pediatric): Secondary | ICD-10-CM | POA: Diagnosis not present

## 2018-05-30 DIAGNOSIS — Z6837 Body mass index (BMI) 37.0-37.9, adult: Secondary | ICD-10-CM | POA: Diagnosis not present

## 2018-05-30 DIAGNOSIS — I10 Essential (primary) hypertension: Secondary | ICD-10-CM | POA: Diagnosis not present

## 2018-05-30 DIAGNOSIS — Z23 Encounter for immunization: Secondary | ICD-10-CM | POA: Diagnosis not present

## 2018-05-30 DIAGNOSIS — M7661 Achilles tendinitis, right leg: Secondary | ICD-10-CM | POA: Diagnosis not present

## 2018-05-30 DIAGNOSIS — Z0000 Encounter for general adult medical examination without abnormal findings: Secondary | ICD-10-CM | POA: Diagnosis not present

## 2018-05-30 DIAGNOSIS — Z1159 Encounter for screening for other viral diseases: Secondary | ICD-10-CM | POA: Diagnosis not present

## 2018-05-30 DIAGNOSIS — E039 Hypothyroidism, unspecified: Secondary | ICD-10-CM | POA: Diagnosis not present

## 2018-05-30 DIAGNOSIS — Z8719 Personal history of other diseases of the digestive system: Secondary | ICD-10-CM | POA: Diagnosis not present

## 2018-05-30 DIAGNOSIS — R7301 Impaired fasting glucose: Secondary | ICD-10-CM | POA: Diagnosis not present

## 2018-05-30 DIAGNOSIS — M8580 Other specified disorders of bone density and structure, unspecified site: Secondary | ICD-10-CM | POA: Diagnosis not present

## 2018-06-01 ENCOUNTER — Emergency Department (HOSPITAL_COMMUNITY)
Admission: EM | Admit: 2018-06-01 | Discharge: 2018-06-01 | Disposition: A | Discharge status: Home / Self Care | Payer: Medicare HMO | Attending: Emergency Medicine | Admitting: Emergency Medicine

## 2018-06-01 ENCOUNTER — Other Ambulatory Visit: Payer: Self-pay

## 2018-06-01 ENCOUNTER — Emergency Department (HOSPITAL_BASED_OUTPATIENT_CLINIC_OR_DEPARTMENT_OTHER): Payer: Medicare HMO

## 2018-06-01 ENCOUNTER — Emergency Department (HOSPITAL_COMMUNITY): Payer: Medicare HMO

## 2018-06-01 ENCOUNTER — Encounter (HOSPITAL_COMMUNITY): Payer: Self-pay

## 2018-06-01 DIAGNOSIS — E039 Hypothyroidism, unspecified: Secondary | ICD-10-CM | POA: Diagnosis not present

## 2018-06-01 DIAGNOSIS — Z79899 Other long term (current) drug therapy: Secondary | ICD-10-CM | POA: Diagnosis not present

## 2018-06-01 DIAGNOSIS — M79604 Pain in right leg: Secondary | ICD-10-CM | POA: Diagnosis not present

## 2018-06-01 DIAGNOSIS — I10 Essential (primary) hypertension: Secondary | ICD-10-CM | POA: Diagnosis not present

## 2018-06-01 DIAGNOSIS — M79609 Pain in unspecified limb: Secondary | ICD-10-CM | POA: Diagnosis not present

## 2018-06-01 DIAGNOSIS — M25571 Pain in right ankle and joints of right foot: Secondary | ICD-10-CM | POA: Diagnosis not present

## 2018-06-01 HISTORY — DX: Essential (primary) hypertension: I10

## 2018-06-01 HISTORY — DX: Disorder of thyroid, unspecified: E07.9

## 2018-06-01 MED ORDER — DICLOFENAC SODIUM 1 % TD GEL: 2.0000 g | Freq: Four times a day (QID) | TRANSDERMAL | 0 refills | Status: AC

## 2018-06-01 NOTE — Progress Notes (Signed)
RLE venous duplex prelim: negative for DVT.  Gwendolynn Merkey Eunice, RDMS, RVT  

## 2018-06-01 NOTE — ED Provider Notes (Signed)
[facility]-EMERGENCY DEPT Provider Note   CSN: [id number] Arrival date & time: 06/01/18  1324     History   Chief Complaint Chief Complaint  Patient presents with  . Leg Pain    right    HPI [name] is a 72 y.o. female.  The history is provided by the patient. No language interpreter was used.  Leg Pain      72 year old female presenting for evaluation of right leg pain.  Patient report 3 weeks ago she developed pain to the back of her right thigh.  She felt that it could be hamstring injury but denies any specific injury.  Since then she felt that she has been changing her gait upon walking and has been noticing pain to her right ankle for the past 2 weeks.  Pain is described as a sharp throbbing sensation rating the back of her ankle up her leg.  Pain is worsening with movement but present at rest.  Sometimes pain is throbbing and pulsating.  She tries using ibuprofen and Voltaren gel at home without adequate relief.  She has been seen by her PCP 2 days prior who recommend rice therapy.  She has follow-up instruction but no improvement.  No associated fever or chills, no nausea no chest pain shortness of breath productive cough or hemoptysis.  No prior history of PE DVT, no recent surgery, prolonged bedrest, active cancer.  She is not a smoker.     Past Medical History:  Diagnosis Date  . Hypertension   . Thyroid disease    hypothydroidism    There are no active problems to display for this patient.   History reviewed. No pertinent surgical history.   OB History   None      Home Medications    Prior to Admission medications   Medication Sig Start Date End Date Taking? Authorizing Provider  Calcium Carb-Cholecalciferol (CALCIUM-VITAMIN D3) 600-400 MG-UNIT CAPS Take 2 tablets by mouth daily. 09/23/10  Yes [provider]  folic acid (FOLVITE) 1 MG tablet Take 2 mg by mouth daily. 05/20/12  Yes [provider]    levothyroxine (SYNTHROID, LEVOTHROID) 100 MCG tablet Take 100 mcg by mouth daily. 07/22/14  Yes [provider]  alendronate (FOSAMAX) 70 MG tablet Take 70 mg by mouth once a week. 03/16/18   [provider]  cetirizine (ZYRTEC) 10 MG tablet Take 10 mg by mouth daily.    [provider]  esomeprazole (NEXIUM) 20 MG capsule Take 20 mg by mouth daily.    [provider]  losartan (COZAAR) 50 MG tablet Take 50 mg by mouth daily.    [provider]  predniSONE (DELTASONE) 1 MG tablet Take 3 mg by mouth daily. 04/20/18   [provider]  simvastatin (ZOCOR) 40 MG tablet Take 40 mg by mouth every evening. 03/26/18   [provider]  SUMAtriptan (IMITREX) 25 MG tablet Take 25 mg by mouth daily as needed for migraine. May take a second dose after 2 hours    [provider]    Family History No family history on file.  Social History Social History   Tobacco Use  . Smoking status: Never Smoker  . Smokeless tobacco: Never Used  Substance Use Topics  . Alcohol use: Never    Frequency: Never  . Drug use: Never     Allergies   Patient has no known allergies.   Review of Systems Review of Systems  All other systems  reviewed and are negative.    Physical Exam Updated Vital Signs BP (!) 126/107 (BP Location: Right Arm)   Pulse 71   Temp 98.4 F (36.9 C) (Oral)   Resp 19   Ht 5' (1.524 m)   Wt 80.7 kg   SpO2 100%   BMI 34.76 kg/m   Physical Exam  Constitutional: She appears well-developed and well-nourished. No distress.  HENT:  Head: Atraumatic.  Eyes: Conjunctivae are normal.  Neck: Neck supple.  Musculoskeletal: She exhibits tenderness (Right lower extremity: Mild tenderness to lateral malleoli region with full range of motion throughout right ankle.  No palpable cords, no erythema or edema throughout leg.  DP pulse palpable with brisk cap refill).  Neurological: She is alert.  Skin: No rash noted.   Psychiatric: She has a normal mood and affect.  Nursing note and vitals reviewed.    ED Treatments / Results  Labs (all labs ordered are listed, but only abnormal results are displayed) Labs Reviewed - No data to display  EKG None  Radiology Dg Ankle Complete Right  Result Date: 06/01/2018 CLINICAL DATA:  Right ankle pain.  No known injury. EXAM: RIGHT ANKLE - COMPLETE 3+ VIEW COMPARISON:  None. FINDINGS: There is no evidence of fracture, dislocation, or joint effusion. Small plantar calcaneal spur noted. There is no evidence of arthropathy or other focal bone abnormality. Soft tissues are unremarkable. IMPRESSION: Negative exam. Electronically Signed   By: Drusilla Kanner M.D.   On: 06/01/2018 13:04    Procedures Procedures (including critical care time)  [x] Farrel Demark I  [] Hover for details RLE venous duplex prelim: negative for DVT.  Farrel Demark, RDMS, RVT  Medications Ordered in ED Medications - No data to display   Initial Impression / Assessment and Plan / ED Course  I have reviewed the triage vital signs and the nursing notes.  Pertinent labs & imaging results that were available during my care of the patient were reviewed by me and considered in my medical decision making (see chart for details).     BP (!) 126/107 (BP Location: Right Arm)   Pulse 71   Temp 98.4 F (36.9 C) (Oral)   Resp 19   Ht 5' (1.524 m)   Wt 80.7 kg   SpO2 100%   BMI 34.76 kg/m    Final Clinical Impressions(s) / ED Diagnoses   Final diagnoses:  Right leg pain    ED Discharge Orders         Ordered    diclofenac sodium (VOLTAREN) 1 % GEL  4 times daily     06/01/18 1332         10:27 AM Patient here with pain to right lower extremity ongoing for the past 2 to 3 weeks.  She does have mild tenderness to the lateral aspects of her right ankle but otherwise ankle with full range of motion.  She has intact distal pedal pulses therefore low suspicion for claudication no  acute ischemia of the leg.  Given her age, and her persistent right lower extremity pain, will obtain a venous duplex ultrasound to rule out DVT.  No evidence of infection.  No injury to suggest fracture dislocation.  Patient able to ambulate.  11:08 AM DVT study of the right lower extremity is negative for acute thrombotic event.  Given her age, will obtain x-ray of the right ankle.  1:24 PM R ankle xray negative.  Will give ASO.  Will give medication for MSK pain.  RICE Therapy  discussed.  Will prescribe voltaren gel. Care discussed with Dr. Clarene Duke.    Fayrene Helper, PA-C 06/01/18 1333    Samuel Jester, DO 06/02/18 [phone]

## 2018-06-01 NOTE — ED Triage Notes (Signed)
Pt states that she thinks she tore a hamstring, and was compensating by walking differently. Pt states that now she is having ankle pain. Pt saw PCP, and has been following RICE, without relief. Pt states she can't get comfortable.

## 2018-06-01 NOTE — Discharge Instructions (Signed)
Please wear ankle brace for support.  Apply voltaren gel up to 4 times daily for pain.  Keep leg elevated and alternate between heat and ice for comfort.

## 2018-06-05 DIAGNOSIS — G4733 Obstructive sleep apnea (adult) (pediatric): Secondary | ICD-10-CM | POA: Diagnosis not present

## 2018-06-07 ENCOUNTER — Ambulatory Visit
Admission: RE | Admit: 2018-06-07 | Discharge: 2018-06-07 | Disposition: A | Discharge status: Home / Self Care | Payer: Medicare HMO | Source: Ambulatory Visit | Attending: Family Medicine | Admitting: Family Medicine

## 2018-06-07 ENCOUNTER — Ambulatory Visit
Admission: RE | Admit: 2018-06-07 | Discharge: 2018-06-07 | Disposition: A | Discharge status: Home / Self Care | Payer: Medicare Other | Source: Ambulatory Visit | Attending: Family Medicine | Admitting: Family Medicine

## 2018-06-07 DIAGNOSIS — R921 Mammographic calcification found on diagnostic imaging of breast: Secondary | ICD-10-CM

## 2018-06-07 DIAGNOSIS — Z7952 Long term (current) use of systemic steroids: Secondary | ICD-10-CM

## 2018-06-07 DIAGNOSIS — M8580 Other specified disorders of bone density and structure, unspecified site: Secondary | ICD-10-CM

## 2018-06-07 DIAGNOSIS — M85852 Other specified disorders of bone density and structure, left thigh: Secondary | ICD-10-CM | POA: Diagnosis not present

## 2018-06-07 DIAGNOSIS — Z780 Asymptomatic menopausal state: Secondary | ICD-10-CM | POA: Diagnosis not present

## 2018-06-14 DIAGNOSIS — M67873 Other specified disorders of tendon, right ankle and foot: Secondary | ICD-10-CM | POA: Diagnosis not present

## 2018-06-27 DIAGNOSIS — M67873 Other specified disorders of tendon, right ankle and foot: Secondary | ICD-10-CM | POA: Diagnosis not present

## 2018-07-05 DIAGNOSIS — M67873 Other specified disorders of tendon, right ankle and foot: Secondary | ICD-10-CM | POA: Diagnosis not present

## 2018-07-09 DIAGNOSIS — M67873 Other specified disorders of tendon, right ankle and foot: Secondary | ICD-10-CM | POA: Diagnosis not present

## 2018-07-19 DIAGNOSIS — M67873 Other specified disorders of tendon, right ankle and foot: Secondary | ICD-10-CM | POA: Diagnosis not present

## 2018-07-25 DIAGNOSIS — M67873 Other specified disorders of tendon, right ankle and foot: Secondary | ICD-10-CM | POA: Diagnosis not present

## 2018-07-26 DIAGNOSIS — M67873 Other specified disorders of tendon, right ankle and foot: Secondary | ICD-10-CM | POA: Diagnosis not present

## 2018-07-29 DIAGNOSIS — R69 Illness, unspecified: Secondary | ICD-10-CM | POA: Diagnosis not present

## 2018-09-13 DIAGNOSIS — H40013 Open angle with borderline findings, low risk, bilateral: Secondary | ICD-10-CM | POA: Diagnosis not present

## 2018-09-27 DIAGNOSIS — M353 Polymyalgia rheumatica: Secondary | ICD-10-CM | POA: Diagnosis not present

## 2018-09-27 DIAGNOSIS — M8589 Other specified disorders of bone density and structure, multiple sites: Secondary | ICD-10-CM | POA: Diagnosis not present

## 2018-09-27 DIAGNOSIS — M150 Primary generalized (osteo)arthritis: Secondary | ICD-10-CM | POA: Diagnosis not present

## 2018-09-27 DIAGNOSIS — Z79899 Other long term (current) drug therapy: Secondary | ICD-10-CM | POA: Diagnosis not present

## 2019-02-09 DIAGNOSIS — J310 Chronic rhinitis: Secondary | ICD-10-CM | POA: Diagnosis not present

## 2019-02-09 DIAGNOSIS — R51 Headache: Secondary | ICD-10-CM | POA: Diagnosis not present

## 2019-03-17 DIAGNOSIS — R69 Illness, unspecified: Secondary | ICD-10-CM | POA: Diagnosis not present

## 2019-03-28 DIAGNOSIS — M8580 Other specified disorders of bone density and structure, unspecified site: Secondary | ICD-10-CM | POA: Diagnosis not present

## 2019-03-28 DIAGNOSIS — E039 Hypothyroidism, unspecified: Secondary | ICD-10-CM | POA: Diagnosis not present

## 2019-03-28 DIAGNOSIS — E785 Hyperlipidemia, unspecified: Secondary | ICD-10-CM | POA: Diagnosis not present

## 2019-03-28 DIAGNOSIS — I10 Essential (primary) hypertension: Secondary | ICD-10-CM | POA: Diagnosis not present

## 2019-03-31 DIAGNOSIS — E039 Hypothyroidism, unspecified: Secondary | ICD-10-CM | POA: Diagnosis not present

## 2019-03-31 DIAGNOSIS — M7661 Achilles tendinitis, right leg: Secondary | ICD-10-CM | POA: Diagnosis not present

## 2019-03-31 DIAGNOSIS — M8580 Other specified disorders of bone density and structure, unspecified site: Secondary | ICD-10-CM | POA: Diagnosis not present

## 2019-03-31 DIAGNOSIS — M353 Polymyalgia rheumatica: Secondary | ICD-10-CM | POA: Diagnosis not present

## 2019-03-31 DIAGNOSIS — Z1159 Encounter for screening for other viral diseases: Secondary | ICD-10-CM | POA: Diagnosis not present

## 2019-03-31 DIAGNOSIS — R7301 Impaired fasting glucose: Secondary | ICD-10-CM | POA: Diagnosis not present

## 2019-03-31 DIAGNOSIS — Z8669 Personal history of other diseases of the nervous system and sense organs: Secondary | ICD-10-CM | POA: Diagnosis not present

## 2019-03-31 DIAGNOSIS — G4733 Obstructive sleep apnea (adult) (pediatric): Secondary | ICD-10-CM | POA: Diagnosis not present

## 2019-03-31 DIAGNOSIS — Z8719 Personal history of other diseases of the digestive system: Secondary | ICD-10-CM | POA: Diagnosis not present

## 2019-03-31 DIAGNOSIS — I10 Essential (primary) hypertension: Secondary | ICD-10-CM | POA: Diagnosis not present

## 2019-04-04 DIAGNOSIS — H40013 Open angle with borderline findings, low risk, bilateral: Secondary | ICD-10-CM | POA: Diagnosis not present

## 2019-05-02 DIAGNOSIS — R69 Illness, unspecified: Secondary | ICD-10-CM | POA: Diagnosis not present

## 2019-05-21 ENCOUNTER — Other Ambulatory Visit: Payer: Self-pay | Admitting: Family Medicine

## 2019-05-21 DIAGNOSIS — Z1231 Encounter for screening mammogram for malignant neoplasm of breast: Secondary | ICD-10-CM

## 2019-06-03 DIAGNOSIS — Z7952 Long term (current) use of systemic steroids: Secondary | ICD-10-CM | POA: Diagnosis not present

## 2019-06-03 DIAGNOSIS — M353 Polymyalgia rheumatica: Secondary | ICD-10-CM | POA: Diagnosis not present

## 2019-06-10 IMAGING — MG DIGITAL DIAGNOSTIC BILATERAL MAMMOGRAM WITH TOMO AND CAD
6 of 10 series · 6 of 26 positions shown · non-contrast
Comparison: Previous exam(s).

ACR Breast Density Category a: The breast tissue is almost entirely
fatty.

CLINICAL DATA: 72-year-old female for 2 year follow-up of LEFT
breast calcifications and for annual bilateral mammogram

EXAM:
DIGITAL DIAGNOSTIC BILATERAL MAMMOGRAM WITH CAD AND TOMO

[L ML]
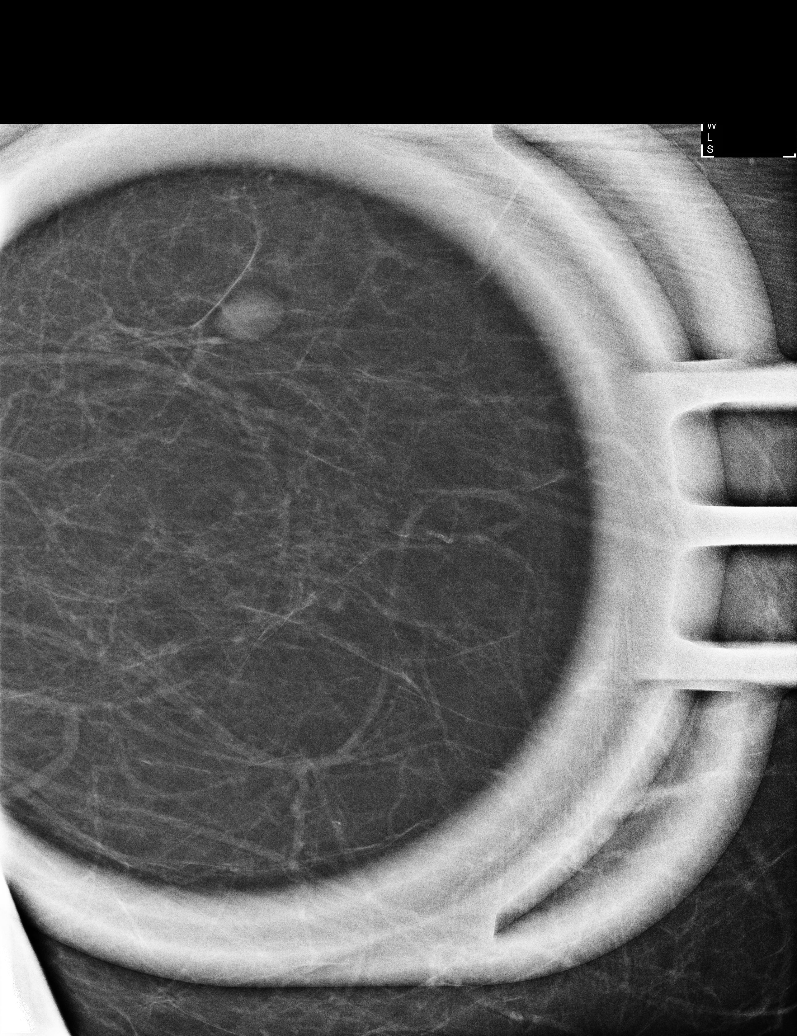

[L CC]
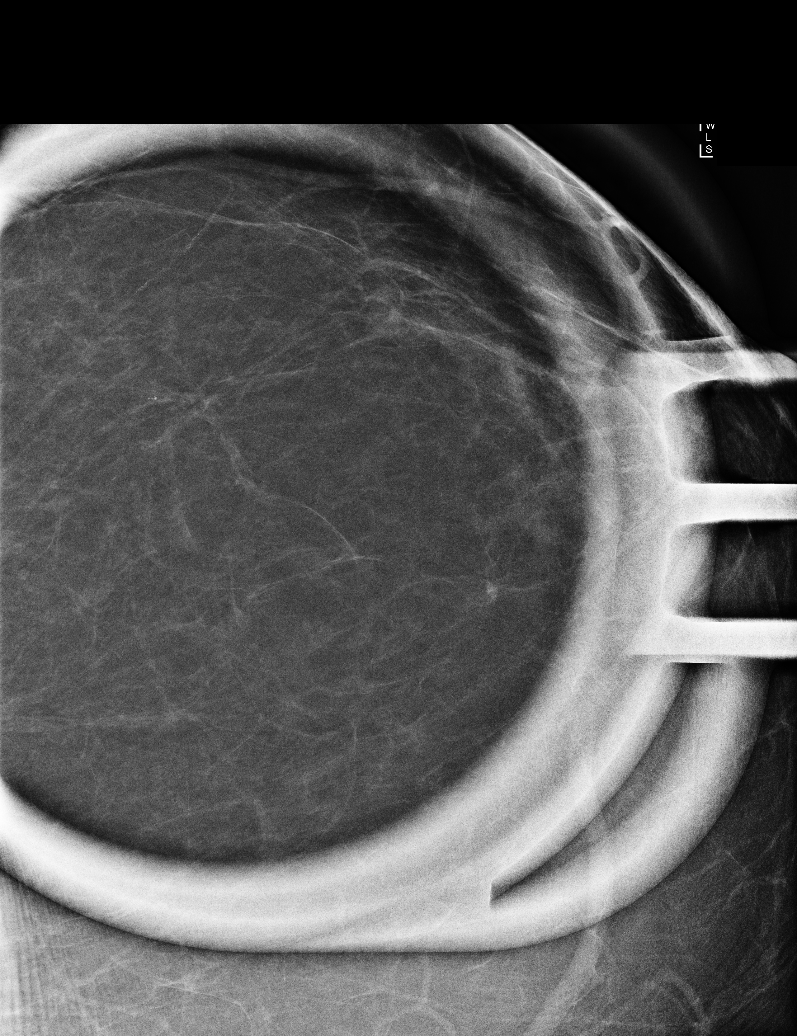

[L MLO synth-2D]
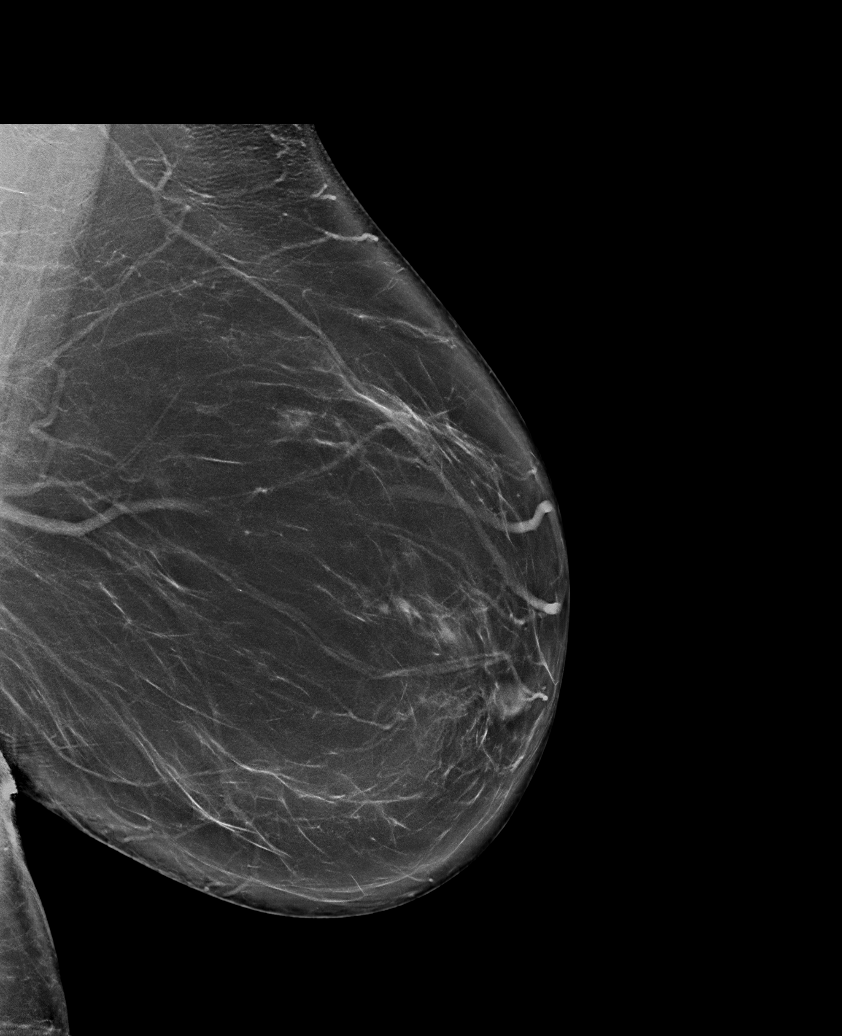

[R MLO synth-2D]
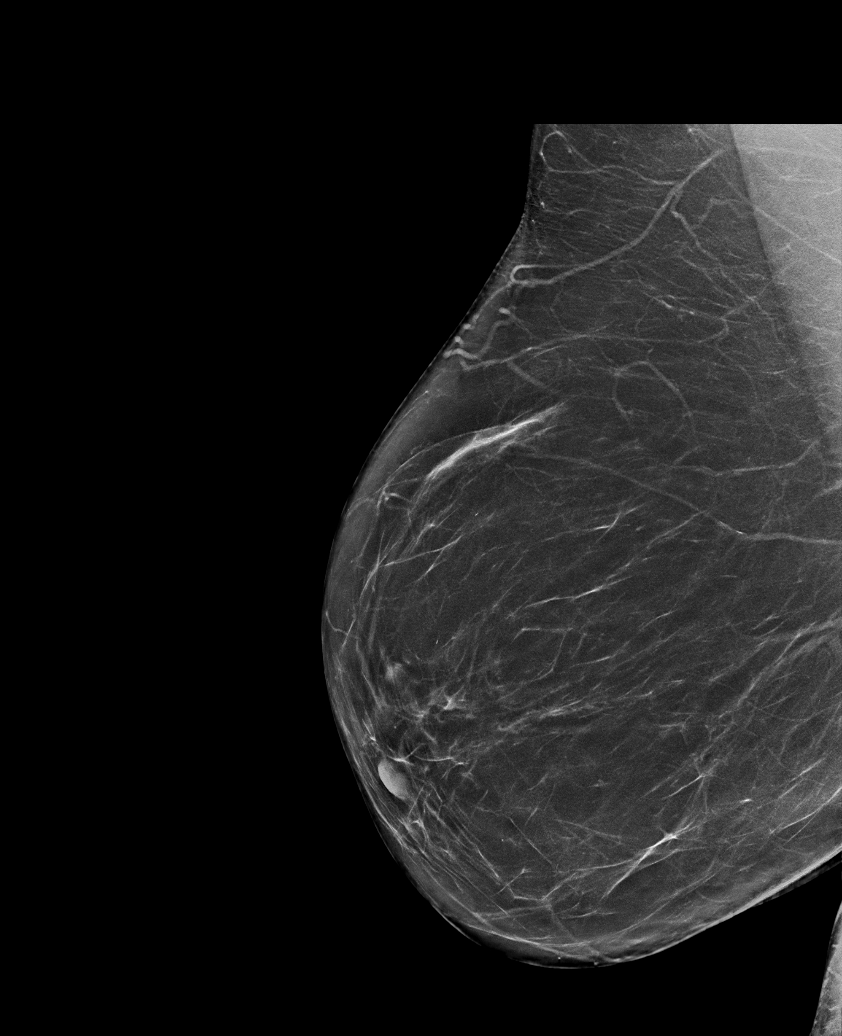

[R CC synth-2D]
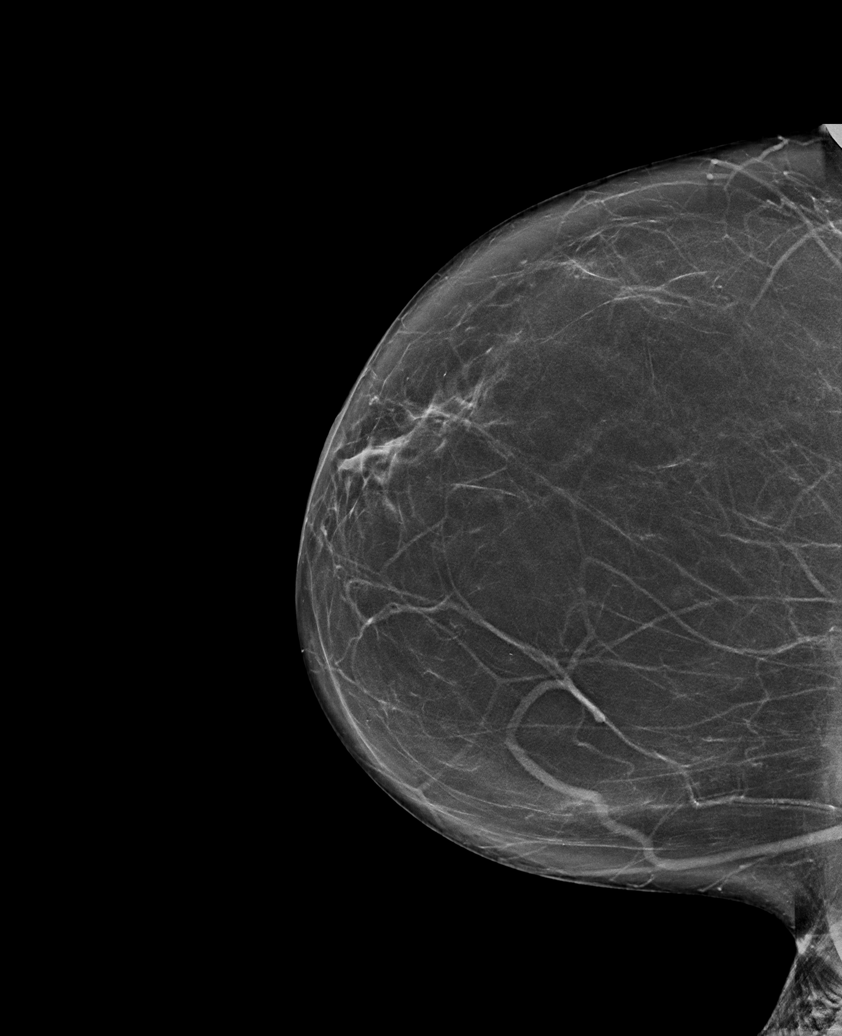

[L CC synth-2D]
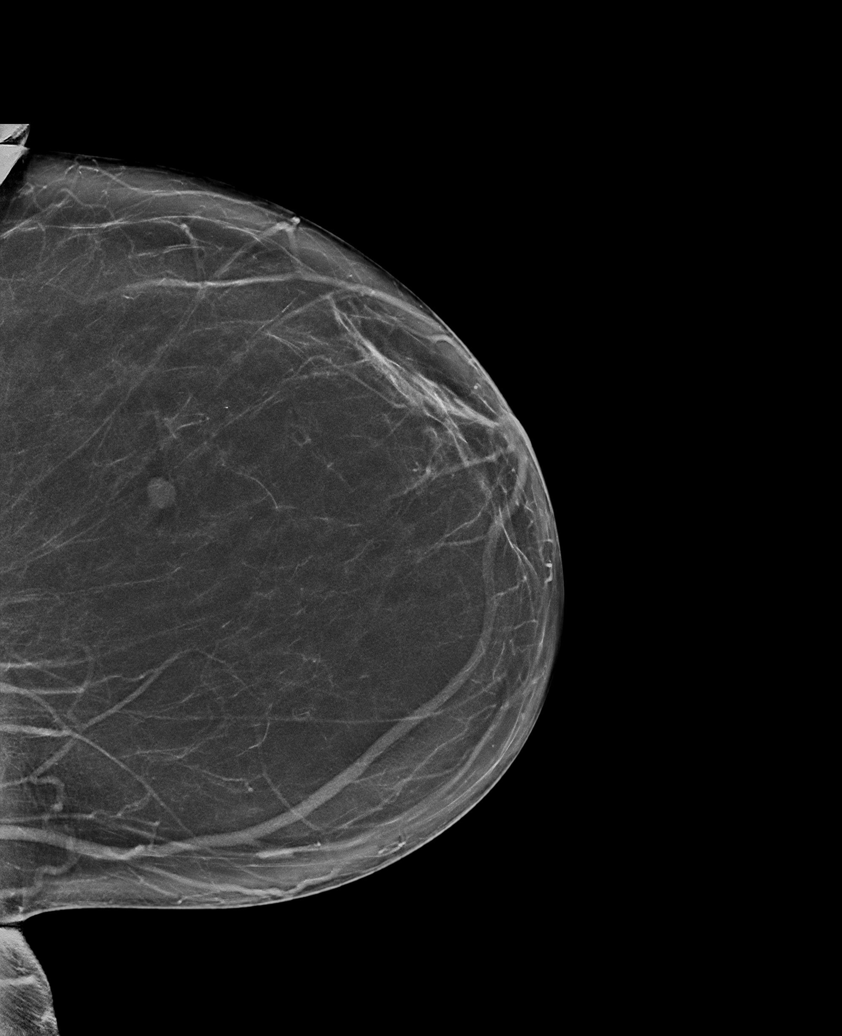

[6 of 26 positions shown; findings below may reference images not displayed]

FINDINGS: 2D/3D full field views of both breast and magnification views of the
LEFT breast are performed.

A stable 2 mm group of punctate calcifications within the OUTER LEFT
breast are unchanged over 2 years.

No suspicious mass, distortion or worrisome calcifications are
noted.

Mammographic images were processed with CAD.
IMPRESSION: 1. Stable OUTER LEFT breast calcifications, unchanged over 2 years
and considered benign.
2. No mammographic evidence of breast malignancy

RECOMMENDATION:
Bilateral screening mammogram in 1 year

I have discussed the findings and recommendations with the patient.
Results were also provided in writing at the conclusion of the
visit. If applicable, a reminder letter will be sent to the patient
regarding the next appointment.

BI-RADS CATEGORY  2: Benign.

## 2019-06-11 DIAGNOSIS — G4733 Obstructive sleep apnea (adult) (pediatric): Secondary | ICD-10-CM | POA: Diagnosis not present

## 2019-07-02 DIAGNOSIS — G4733 Obstructive sleep apnea (adult) (pediatric): Secondary | ICD-10-CM | POA: Diagnosis not present

## 2019-07-02 DIAGNOSIS — I10 Essential (primary) hypertension: Secondary | ICD-10-CM | POA: Diagnosis not present

## 2019-07-02 DIAGNOSIS — R8290 Unspecified abnormal findings in urine: Secondary | ICD-10-CM | POA: Diagnosis not present

## 2019-07-02 DIAGNOSIS — Z0000 Encounter for general adult medical examination without abnormal findings: Secondary | ICD-10-CM | POA: Diagnosis not present

## 2019-07-02 DIAGNOSIS — R7301 Impaired fasting glucose: Secondary | ICD-10-CM | POA: Diagnosis not present

## 2019-07-02 DIAGNOSIS — E785 Hyperlipidemia, unspecified: Secondary | ICD-10-CM | POA: Diagnosis not present

## 2019-07-02 DIAGNOSIS — M353 Polymyalgia rheumatica: Secondary | ICD-10-CM | POA: Diagnosis not present

## 2019-07-02 DIAGNOSIS — M8580 Other specified disorders of bone density and structure, unspecified site: Secondary | ICD-10-CM | POA: Diagnosis not present

## 2019-07-02 DIAGNOSIS — E039 Hypothyroidism, unspecified: Secondary | ICD-10-CM | POA: Diagnosis not present

## 2019-07-03 DIAGNOSIS — G4733 Obstructive sleep apnea (adult) (pediatric): Secondary | ICD-10-CM | POA: Diagnosis not present

## 2019-07-07 ENCOUNTER — Ambulatory Visit
Admission: RE | Admit: 2019-07-07 | Discharge: 2019-07-07 | Disposition: A | Discharge status: Home / Self Care | Payer: Medicare HMO | Source: Ambulatory Visit | Attending: Family Medicine | Admitting: Family Medicine

## 2019-07-07 ENCOUNTER — Other Ambulatory Visit: Payer: Self-pay

## 2019-07-07 DIAGNOSIS — Z1231 Encounter for screening mammogram for malignant neoplasm of breast: Secondary | ICD-10-CM

## 2019-08-02 DIAGNOSIS — G4733 Obstructive sleep apnea (adult) (pediatric): Secondary | ICD-10-CM | POA: Diagnosis not present

## 2019-08-11 DIAGNOSIS — I10 Essential (primary) hypertension: Secondary | ICD-10-CM | POA: Diagnosis not present

## 2019-08-11 DIAGNOSIS — M8580 Other specified disorders of bone density and structure, unspecified site: Secondary | ICD-10-CM | POA: Diagnosis not present

## 2019-08-11 DIAGNOSIS — E039 Hypothyroidism, unspecified: Secondary | ICD-10-CM | POA: Diagnosis not present

## 2019-08-11 DIAGNOSIS — E785 Hyperlipidemia, unspecified: Secondary | ICD-10-CM | POA: Diagnosis not present

## 2019-09-02 DIAGNOSIS — G4733 Obstructive sleep apnea (adult) (pediatric): Secondary | ICD-10-CM | POA: Diagnosis not present

## 2019-09-05 DIAGNOSIS — H40013 Open angle with borderline findings, low risk, bilateral: Secondary | ICD-10-CM | POA: Diagnosis not present

## 2019-09-15 DIAGNOSIS — G4733 Obstructive sleep apnea (adult) (pediatric): Secondary | ICD-10-CM | POA: Diagnosis not present

## 2019-09-22 DIAGNOSIS — R69 Illness, unspecified: Secondary | ICD-10-CM | POA: Diagnosis not present

## 2019-10-03 DIAGNOSIS — G4733 Obstructive sleep apnea (adult) (pediatric): Secondary | ICD-10-CM | POA: Diagnosis not present

## 2019-10-04 DIAGNOSIS — G4733 Obstructive sleep apnea (adult) (pediatric): Secondary | ICD-10-CM | POA: Diagnosis not present

## 2019-10-31 DIAGNOSIS — G4733 Obstructive sleep apnea (adult) (pediatric): Secondary | ICD-10-CM | POA: Diagnosis not present

## 2019-11-01 DIAGNOSIS — G4733 Obstructive sleep apnea (adult) (pediatric): Secondary | ICD-10-CM | POA: Diagnosis not present

## 2019-12-01 DIAGNOSIS — E785 Hyperlipidemia, unspecified: Secondary | ICD-10-CM | POA: Diagnosis not present

## 2019-12-01 DIAGNOSIS — G4733 Obstructive sleep apnea (adult) (pediatric): Secondary | ICD-10-CM | POA: Diagnosis not present

## 2019-12-01 DIAGNOSIS — M8580 Other specified disorders of bone density and structure, unspecified site: Secondary | ICD-10-CM | POA: Diagnosis not present

## 2019-12-01 DIAGNOSIS — E039 Hypothyroidism, unspecified: Secondary | ICD-10-CM | POA: Diagnosis not present

## 2019-12-01 DIAGNOSIS — I10 Essential (primary) hypertension: Secondary | ICD-10-CM | POA: Diagnosis not present

## 2019-12-02 DIAGNOSIS — G4733 Obstructive sleep apnea (adult) (pediatric): Secondary | ICD-10-CM | POA: Diagnosis not present

## 2019-12-31 DIAGNOSIS — G4733 Obstructive sleep apnea (adult) (pediatric): Secondary | ICD-10-CM | POA: Diagnosis not present

## 2020-01-02 DIAGNOSIS — G4733 Obstructive sleep apnea (adult) (pediatric): Secondary | ICD-10-CM | POA: Diagnosis not present

## 2020-01-07 DIAGNOSIS — G4733 Obstructive sleep apnea (adult) (pediatric): Secondary | ICD-10-CM | POA: Diagnosis not present

## 2020-01-22 DIAGNOSIS — E039 Hypothyroidism, unspecified: Secondary | ICD-10-CM | POA: Diagnosis not present

## 2020-01-22 DIAGNOSIS — E785 Hyperlipidemia, unspecified: Secondary | ICD-10-CM | POA: Diagnosis not present

## 2020-01-22 DIAGNOSIS — I10 Essential (primary) hypertension: Secondary | ICD-10-CM | POA: Diagnosis not present

## 2020-01-22 DIAGNOSIS — M8580 Other specified disorders of bone density and structure, unspecified site: Secondary | ICD-10-CM | POA: Diagnosis not present

## 2020-01-31 DIAGNOSIS — G4733 Obstructive sleep apnea (adult) (pediatric): Secondary | ICD-10-CM | POA: Diagnosis not present

## 2020-02-02 DIAGNOSIS — G4733 Obstructive sleep apnea (adult) (pediatric): Secondary | ICD-10-CM | POA: Diagnosis not present

## 2020-03-01 DIAGNOSIS — G4733 Obstructive sleep apnea (adult) (pediatric): Secondary | ICD-10-CM | POA: Diagnosis not present

## 2020-03-03 DIAGNOSIS — G4733 Obstructive sleep apnea (adult) (pediatric): Secondary | ICD-10-CM | POA: Diagnosis not present

## 2020-03-08 DIAGNOSIS — Z0100 Encounter for examination of eyes and vision without abnormal findings: Secondary | ICD-10-CM | POA: Diagnosis not present

## 2020-03-08 DIAGNOSIS — H40013 Open angle with borderline findings, low risk, bilateral: Secondary | ICD-10-CM | POA: Diagnosis not present

## 2020-03-22 DIAGNOSIS — R69 Illness, unspecified: Secondary | ICD-10-CM | POA: Diagnosis not present

## 2020-04-01 DIAGNOSIS — G4733 Obstructive sleep apnea (adult) (pediatric): Secondary | ICD-10-CM | POA: Diagnosis not present

## 2020-04-02 DIAGNOSIS — M8580 Other specified disorders of bone density and structure, unspecified site: Secondary | ICD-10-CM | POA: Diagnosis not present

## 2020-04-02 DIAGNOSIS — E039 Hypothyroidism, unspecified: Secondary | ICD-10-CM | POA: Diagnosis not present

## 2020-04-02 DIAGNOSIS — I10 Essential (primary) hypertension: Secondary | ICD-10-CM | POA: Diagnosis not present

## 2020-04-02 DIAGNOSIS — E785 Hyperlipidemia, unspecified: Secondary | ICD-10-CM | POA: Diagnosis not present

## 2020-05-06 DIAGNOSIS — R69 Illness, unspecified: Secondary | ICD-10-CM | POA: Diagnosis not present

## 2020-05-18 DIAGNOSIS — M8580 Other specified disorders of bone density and structure, unspecified site: Secondary | ICD-10-CM | POA: Diagnosis not present

## 2020-05-18 DIAGNOSIS — I10 Essential (primary) hypertension: Secondary | ICD-10-CM | POA: Diagnosis not present

## 2020-05-18 DIAGNOSIS — E785 Hyperlipidemia, unspecified: Secondary | ICD-10-CM | POA: Diagnosis not present

## 2020-05-18 DIAGNOSIS — E039 Hypothyroidism, unspecified: Secondary | ICD-10-CM | POA: Diagnosis not present

## 2020-06-01 DIAGNOSIS — M353 Polymyalgia rheumatica: Secondary | ICD-10-CM | POA: Diagnosis not present

## 2020-06-01 DIAGNOSIS — Z7952 Long term (current) use of systemic steroids: Secondary | ICD-10-CM | POA: Diagnosis not present

## 2020-06-03 ENCOUNTER — Other Ambulatory Visit: Payer: Self-pay | Admitting: Family Medicine

## 2020-06-03 DIAGNOSIS — Z1231 Encounter for screening mammogram for malignant neoplasm of breast: Secondary | ICD-10-CM

## 2020-06-03 DIAGNOSIS — Z13820 Encounter for screening for osteoporosis: Secondary | ICD-10-CM

## 2020-06-15 DIAGNOSIS — R69 Illness, unspecified: Secondary | ICD-10-CM | POA: Diagnosis not present

## 2020-06-30 DIAGNOSIS — G4733 Obstructive sleep apnea (adult) (pediatric): Secondary | ICD-10-CM | POA: Diagnosis not present

## 2020-07-07 ENCOUNTER — Ambulatory Visit
Admission: RE | Admit: 2020-07-07 | Discharge: 2020-07-07 | Disposition: A | Discharge status: Home / Self Care | Payer: Medicare HMO | Source: Ambulatory Visit | Attending: Family Medicine | Admitting: Family Medicine

## 2020-07-07 ENCOUNTER — Other Ambulatory Visit: Payer: Self-pay

## 2020-07-07 DIAGNOSIS — Z1231 Encounter for screening mammogram for malignant neoplasm of breast: Secondary | ICD-10-CM

## 2020-07-21 DIAGNOSIS — M8580 Other specified disorders of bone density and structure, unspecified site: Secondary | ICD-10-CM | POA: Diagnosis not present

## 2020-07-21 DIAGNOSIS — I10 Essential (primary) hypertension: Secondary | ICD-10-CM | POA: Diagnosis not present

## 2020-07-21 DIAGNOSIS — E785 Hyperlipidemia, unspecified: Secondary | ICD-10-CM | POA: Diagnosis not present

## 2020-07-21 DIAGNOSIS — E039 Hypothyroidism, unspecified: Secondary | ICD-10-CM | POA: Diagnosis not present

## 2020-07-23 DIAGNOSIS — Z8719 Personal history of other diseases of the digestive system: Secondary | ICD-10-CM | POA: Diagnosis not present

## 2020-07-23 DIAGNOSIS — R7303 Prediabetes: Secondary | ICD-10-CM | POA: Diagnosis not present

## 2020-07-23 DIAGNOSIS — Z0000 Encounter for general adult medical examination without abnormal findings: Secondary | ICD-10-CM | POA: Diagnosis not present

## 2020-07-23 DIAGNOSIS — Z8669 Personal history of other diseases of the nervous system and sense organs: Secondary | ICD-10-CM | POA: Diagnosis not present

## 2020-07-23 DIAGNOSIS — E785 Hyperlipidemia, unspecified: Secondary | ICD-10-CM | POA: Diagnosis not present

## 2020-07-23 DIAGNOSIS — M353 Polymyalgia rheumatica: Secondary | ICD-10-CM | POA: Diagnosis not present

## 2020-07-23 DIAGNOSIS — R7301 Impaired fasting glucose: Secondary | ICD-10-CM | POA: Diagnosis not present

## 2020-07-23 DIAGNOSIS — M8580 Other specified disorders of bone density and structure, unspecified site: Secondary | ICD-10-CM | POA: Diagnosis not present

## 2020-07-23 DIAGNOSIS — I10 Essential (primary) hypertension: Secondary | ICD-10-CM | POA: Diagnosis not present

## 2020-07-23 DIAGNOSIS — G4733 Obstructive sleep apnea (adult) (pediatric): Secondary | ICD-10-CM | POA: Diagnosis not present

## 2020-07-28 DIAGNOSIS — M353 Polymyalgia rheumatica: Secondary | ICD-10-CM | POA: Diagnosis not present

## 2020-07-28 DIAGNOSIS — I10 Essential (primary) hypertension: Secondary | ICD-10-CM | POA: Diagnosis not present

## 2020-07-28 DIAGNOSIS — E785 Hyperlipidemia, unspecified: Secondary | ICD-10-CM | POA: Diagnosis not present

## 2020-07-28 DIAGNOSIS — Z8669 Personal history of other diseases of the nervous system and sense organs: Secondary | ICD-10-CM | POA: Diagnosis not present

## 2020-07-28 DIAGNOSIS — N289 Disorder of kidney and ureter, unspecified: Secondary | ICD-10-CM | POA: Diagnosis not present

## 2020-07-28 DIAGNOSIS — Z8719 Personal history of other diseases of the digestive system: Secondary | ICD-10-CM | POA: Diagnosis not present

## 2020-07-28 DIAGNOSIS — R7303 Prediabetes: Secondary | ICD-10-CM | POA: Diagnosis not present

## 2020-07-28 DIAGNOSIS — Z0000 Encounter for general adult medical examination without abnormal findings: Secondary | ICD-10-CM | POA: Diagnosis not present

## 2020-07-28 DIAGNOSIS — G4733 Obstructive sleep apnea (adult) (pediatric): Secondary | ICD-10-CM | POA: Diagnosis not present

## 2020-07-28 DIAGNOSIS — M8580 Other specified disorders of bone density and structure, unspecified site: Secondary | ICD-10-CM | POA: Diagnosis not present

## 2020-08-18 DIAGNOSIS — E785 Hyperlipidemia, unspecified: Secondary | ICD-10-CM | POA: Diagnosis not present

## 2020-08-18 DIAGNOSIS — M8580 Other specified disorders of bone density and structure, unspecified site: Secondary | ICD-10-CM | POA: Diagnosis not present

## 2020-08-18 DIAGNOSIS — E039 Hypothyroidism, unspecified: Secondary | ICD-10-CM | POA: Diagnosis not present

## 2020-08-18 DIAGNOSIS — I10 Essential (primary) hypertension: Secondary | ICD-10-CM | POA: Diagnosis not present

## 2020-09-08 DIAGNOSIS — R7303 Prediabetes: Secondary | ICD-10-CM | POA: Diagnosis not present

## 2020-09-08 DIAGNOSIS — Z8719 Personal history of other diseases of the digestive system: Secondary | ICD-10-CM | POA: Diagnosis not present

## 2020-09-08 DIAGNOSIS — E039 Hypothyroidism, unspecified: Secondary | ICD-10-CM | POA: Diagnosis not present

## 2020-09-08 DIAGNOSIS — I10 Essential (primary) hypertension: Secondary | ICD-10-CM | POA: Diagnosis not present

## 2020-09-08 DIAGNOSIS — Z8669 Personal history of other diseases of the nervous system and sense organs: Secondary | ICD-10-CM | POA: Diagnosis not present

## 2020-09-08 DIAGNOSIS — M353 Polymyalgia rheumatica: Secondary | ICD-10-CM | POA: Diagnosis not present

## 2020-09-08 DIAGNOSIS — G4733 Obstructive sleep apnea (adult) (pediatric): Secondary | ICD-10-CM | POA: Diagnosis not present

## 2020-09-08 DIAGNOSIS — N289 Disorder of kidney and ureter, unspecified: Secondary | ICD-10-CM | POA: Diagnosis not present

## 2020-09-08 DIAGNOSIS — E785 Hyperlipidemia, unspecified: Secondary | ICD-10-CM | POA: Diagnosis not present

## 2020-09-09 DIAGNOSIS — H40013 Open angle with borderline findings, low risk, bilateral: Secondary | ICD-10-CM | POA: Diagnosis not present

## 2020-09-15 ENCOUNTER — Ambulatory Visit
Admission: RE | Admit: 2020-09-15 | Discharge: 2020-09-15 | Disposition: A | Discharge status: Home / Self Care | Payer: Medicare HMO | Source: Ambulatory Visit | Attending: Family Medicine | Admitting: Family Medicine

## 2020-09-15 ENCOUNTER — Other Ambulatory Visit: Payer: Self-pay

## 2020-09-15 DIAGNOSIS — Z780 Asymptomatic menopausal state: Secondary | ICD-10-CM | POA: Diagnosis not present

## 2020-09-15 DIAGNOSIS — M85852 Other specified disorders of bone density and structure, left thigh: Secondary | ICD-10-CM | POA: Diagnosis not present

## 2020-09-15 DIAGNOSIS — Z13820 Encounter for screening for osteoporosis: Secondary | ICD-10-CM

## 2020-09-23 DIAGNOSIS — G4733 Obstructive sleep apnea (adult) (pediatric): Secondary | ICD-10-CM | POA: Diagnosis not present

## 2020-09-28 DIAGNOSIS — G4733 Obstructive sleep apnea (adult) (pediatric): Secondary | ICD-10-CM | POA: Diagnosis not present

## 2020-10-19 DIAGNOSIS — I10 Essential (primary) hypertension: Secondary | ICD-10-CM | POA: Diagnosis not present

## 2020-10-19 DIAGNOSIS — E785 Hyperlipidemia, unspecified: Secondary | ICD-10-CM | POA: Diagnosis not present

## 2020-10-19 DIAGNOSIS — M8580 Other specified disorders of bone density and structure, unspecified site: Secondary | ICD-10-CM | POA: Diagnosis not present

## 2020-10-19 DIAGNOSIS — E039 Hypothyroidism, unspecified: Secondary | ICD-10-CM | POA: Diagnosis not present

## 2020-10-26 DIAGNOSIS — G4733 Obstructive sleep apnea (adult) (pediatric): Secondary | ICD-10-CM | POA: Diagnosis not present

## 2020-11-26 DIAGNOSIS — G4733 Obstructive sleep apnea (adult) (pediatric): Secondary | ICD-10-CM | POA: Diagnosis not present

## 2020-11-30 DIAGNOSIS — R7303 Prediabetes: Secondary | ICD-10-CM | POA: Diagnosis not present

## 2020-11-30 DIAGNOSIS — Z8669 Personal history of other diseases of the nervous system and sense organs: Secondary | ICD-10-CM | POA: Diagnosis not present

## 2020-11-30 DIAGNOSIS — G4733 Obstructive sleep apnea (adult) (pediatric): Secondary | ICD-10-CM | POA: Diagnosis not present

## 2020-11-30 DIAGNOSIS — N289 Disorder of kidney and ureter, unspecified: Secondary | ICD-10-CM | POA: Diagnosis not present

## 2020-11-30 DIAGNOSIS — E785 Hyperlipidemia, unspecified: Secondary | ICD-10-CM | POA: Diagnosis not present

## 2020-11-30 DIAGNOSIS — M8580 Other specified disorders of bone density and structure, unspecified site: Secondary | ICD-10-CM | POA: Diagnosis not present

## 2020-11-30 DIAGNOSIS — R8290 Unspecified abnormal findings in urine: Secondary | ICD-10-CM | POA: Diagnosis not present

## 2020-11-30 DIAGNOSIS — M353 Polymyalgia rheumatica: Secondary | ICD-10-CM | POA: Diagnosis not present

## 2020-11-30 DIAGNOSIS — Z8719 Personal history of other diseases of the digestive system: Secondary | ICD-10-CM | POA: Diagnosis not present

## 2020-11-30 DIAGNOSIS — I10 Essential (primary) hypertension: Secondary | ICD-10-CM | POA: Diagnosis not present

## 2020-11-30 DIAGNOSIS — Z131 Encounter for screening for diabetes mellitus: Secondary | ICD-10-CM | POA: Diagnosis not present

## 2020-11-30 DIAGNOSIS — E039 Hypothyroidism, unspecified: Secondary | ICD-10-CM | POA: Diagnosis not present

## 2020-12-21 DIAGNOSIS — G4733 Obstructive sleep apnea (adult) (pediatric): Secondary | ICD-10-CM | POA: Diagnosis not present

## 2020-12-26 DIAGNOSIS — G4733 Obstructive sleep apnea (adult) (pediatric): Secondary | ICD-10-CM | POA: Diagnosis not present

## 2020-12-29 DIAGNOSIS — E785 Hyperlipidemia, unspecified: Secondary | ICD-10-CM | POA: Diagnosis not present

## 2020-12-29 DIAGNOSIS — E039 Hypothyroidism, unspecified: Secondary | ICD-10-CM | POA: Diagnosis not present

## 2020-12-29 DIAGNOSIS — I10 Essential (primary) hypertension: Secondary | ICD-10-CM | POA: Diagnosis not present

## 2020-12-29 DIAGNOSIS — M8580 Other specified disorders of bone density and structure, unspecified site: Secondary | ICD-10-CM | POA: Diagnosis not present

## 2021-01-21 DIAGNOSIS — G4733 Obstructive sleep apnea (adult) (pediatric): Secondary | ICD-10-CM | POA: Diagnosis not present

## 2021-02-20 DIAGNOSIS — G4733 Obstructive sleep apnea (adult) (pediatric): Secondary | ICD-10-CM | POA: Diagnosis not present

## 2021-03-10 DIAGNOSIS — H40013 Open angle with borderline findings, low risk, bilateral: Secondary | ICD-10-CM | POA: Diagnosis not present

## 2021-03-11 DIAGNOSIS — E039 Hypothyroidism, unspecified: Secondary | ICD-10-CM | POA: Diagnosis not present

## 2021-03-11 DIAGNOSIS — I10 Essential (primary) hypertension: Secondary | ICD-10-CM | POA: Diagnosis not present

## 2021-03-11 DIAGNOSIS — M8580 Other specified disorders of bone density and structure, unspecified site: Secondary | ICD-10-CM | POA: Diagnosis not present

## 2021-03-11 DIAGNOSIS — E785 Hyperlipidemia, unspecified: Secondary | ICD-10-CM | POA: Diagnosis not present

## 2021-03-15 DIAGNOSIS — G4733 Obstructive sleep apnea (adult) (pediatric): Secondary | ICD-10-CM | POA: Diagnosis not present

## 2021-05-24 ENCOUNTER — Other Ambulatory Visit: Payer: Self-pay | Admitting: Family Medicine

## 2021-05-24 DIAGNOSIS — Z1231 Encounter for screening mammogram for malignant neoplasm of breast: Secondary | ICD-10-CM

## 2021-06-08 DIAGNOSIS — G4733 Obstructive sleep apnea (adult) (pediatric): Secondary | ICD-10-CM | POA: Diagnosis not present

## 2021-07-11 ENCOUNTER — Ambulatory Visit
Admission: RE | Admit: 2021-07-11 | Discharge: 2021-07-11 | Disposition: A | Discharge status: Home / Self Care | Payer: Medicare HMO | Source: Ambulatory Visit | Attending: Family Medicine | Admitting: Family Medicine

## 2021-07-11 DIAGNOSIS — Z1231 Encounter for screening mammogram for malignant neoplasm of breast: Secondary | ICD-10-CM | POA: Diagnosis not present

## 2021-07-21 DIAGNOSIS — Z7952 Long term (current) use of systemic steroids: Secondary | ICD-10-CM | POA: Diagnosis not present

## 2021-07-21 DIAGNOSIS — M353 Polymyalgia rheumatica: Secondary | ICD-10-CM | POA: Diagnosis not present

## 2021-07-29 DIAGNOSIS — Z0000 Encounter for general adult medical examination without abnormal findings: Secondary | ICD-10-CM | POA: Diagnosis not present

## 2021-07-29 DIAGNOSIS — E785 Hyperlipidemia, unspecified: Secondary | ICD-10-CM | POA: Diagnosis not present

## 2021-07-29 DIAGNOSIS — Z79899 Other long term (current) drug therapy: Secondary | ICD-10-CM | POA: Diagnosis not present

## 2021-07-29 DIAGNOSIS — R7303 Prediabetes: Secondary | ICD-10-CM | POA: Diagnosis not present

## 2021-07-29 DIAGNOSIS — E039 Hypothyroidism, unspecified: Secondary | ICD-10-CM | POA: Diagnosis not present

## 2021-07-29 DIAGNOSIS — M8580 Other specified disorders of bone density and structure, unspecified site: Secondary | ICD-10-CM | POA: Diagnosis not present

## 2021-07-29 DIAGNOSIS — I10 Essential (primary) hypertension: Secondary | ICD-10-CM | POA: Diagnosis not present

## 2021-08-03 DIAGNOSIS — R7303 Prediabetes: Secondary | ICD-10-CM | POA: Diagnosis not present

## 2021-08-03 DIAGNOSIS — E785 Hyperlipidemia, unspecified: Secondary | ICD-10-CM | POA: Diagnosis not present

## 2021-08-03 DIAGNOSIS — E039 Hypothyroidism, unspecified: Secondary | ICD-10-CM | POA: Diagnosis not present

## 2021-08-03 DIAGNOSIS — M8580 Other specified disorders of bone density and structure, unspecified site: Secondary | ICD-10-CM | POA: Diagnosis not present

## 2021-08-03 DIAGNOSIS — I10 Essential (primary) hypertension: Secondary | ICD-10-CM | POA: Diagnosis not present

## 2021-08-03 DIAGNOSIS — Z79899 Other long term (current) drug therapy: Secondary | ICD-10-CM | POA: Diagnosis not present

## 2021-08-31 DIAGNOSIS — G4733 Obstructive sleep apnea (adult) (pediatric): Secondary | ICD-10-CM | POA: Diagnosis not present

## 2021-09-06 DIAGNOSIS — H40013 Open angle with borderline findings, low risk, bilateral: Secondary | ICD-10-CM | POA: Diagnosis not present

## 2021-09-08 DIAGNOSIS — G4733 Obstructive sleep apnea (adult) (pediatric): Secondary | ICD-10-CM | POA: Diagnosis not present

## 2021-10-06 DIAGNOSIS — G4733 Obstructive sleep apnea (adult) (pediatric): Secondary | ICD-10-CM | POA: Diagnosis not present

## 2021-11-09 DIAGNOSIS — E039 Hypothyroidism, unspecified: Secondary | ICD-10-CM | POA: Diagnosis not present

## 2021-12-02 DIAGNOSIS — G4733 Obstructive sleep apnea (adult) (pediatric): Secondary | ICD-10-CM | POA: Diagnosis not present

## 2021-12-07 DIAGNOSIS — G4733 Obstructive sleep apnea (adult) (pediatric): Secondary | ICD-10-CM | POA: Diagnosis not present

## 2021-12-27 DIAGNOSIS — U071 COVID-19: Secondary | ICD-10-CM | POA: Diagnosis not present

## 2022-01-27 DIAGNOSIS — M8580 Other specified disorders of bone density and structure, unspecified site: Secondary | ICD-10-CM | POA: Diagnosis not present

## 2022-01-27 DIAGNOSIS — D72829 Elevated white blood cell count, unspecified: Secondary | ICD-10-CM | POA: Diagnosis not present

## 2022-01-27 DIAGNOSIS — E039 Hypothyroidism, unspecified: Secondary | ICD-10-CM | POA: Diagnosis not present

## 2022-01-27 DIAGNOSIS — I10 Essential (primary) hypertension: Secondary | ICD-10-CM | POA: Diagnosis not present

## 2022-01-27 DIAGNOSIS — R7303 Prediabetes: Secondary | ICD-10-CM | POA: Diagnosis not present

## 2022-01-27 DIAGNOSIS — E785 Hyperlipidemia, unspecified: Secondary | ICD-10-CM | POA: Diagnosis not present

## 2022-03-02 DIAGNOSIS — G4733 Obstructive sleep apnea (adult) (pediatric): Secondary | ICD-10-CM | POA: Diagnosis not present

## 2022-05-29 ENCOUNTER — Other Ambulatory Visit: Payer: Self-pay | Admitting: Family Medicine

## 2022-05-29 DIAGNOSIS — Z1231 Encounter for screening mammogram for malignant neoplasm of breast: Secondary | ICD-10-CM

## 2022-05-31 DIAGNOSIS — G4733 Obstructive sleep apnea (adult) (pediatric): Secondary | ICD-10-CM | POA: Diagnosis not present

## 2022-07-18 DIAGNOSIS — Z79899 Other long term (current) drug therapy: Secondary | ICD-10-CM | POA: Diagnosis not present

## 2022-07-18 DIAGNOSIS — M353 Polymyalgia rheumatica: Secondary | ICD-10-CM | POA: Diagnosis not present

## 2022-07-18 DIAGNOSIS — E039 Hypothyroidism, unspecified: Secondary | ICD-10-CM | POA: Diagnosis not present

## 2022-07-18 DIAGNOSIS — M8580 Other specified disorders of bone density and structure, unspecified site: Secondary | ICD-10-CM | POA: Diagnosis not present

## 2022-07-18 DIAGNOSIS — E785 Hyperlipidemia, unspecified: Secondary | ICD-10-CM | POA: Diagnosis not present

## 2022-07-18 DIAGNOSIS — G4733 Obstructive sleep apnea (adult) (pediatric): Secondary | ICD-10-CM | POA: Diagnosis not present

## 2022-07-18 DIAGNOSIS — R7303 Prediabetes: Secondary | ICD-10-CM | POA: Diagnosis not present

## 2022-07-18 DIAGNOSIS — Z0000 Encounter for general adult medical examination without abnormal findings: Secondary | ICD-10-CM | POA: Diagnosis not present

## 2022-07-18 DIAGNOSIS — I10 Essential (primary) hypertension: Secondary | ICD-10-CM | POA: Diagnosis not present

## 2022-07-18 DIAGNOSIS — Z23 Encounter for immunization: Secondary | ICD-10-CM | POA: Diagnosis not present

## 2022-07-19 ENCOUNTER — Other Ambulatory Visit: Payer: Self-pay | Admitting: Family Medicine

## 2022-07-19 ENCOUNTER — Ambulatory Visit
Admission: RE | Admit: 2022-07-19 | Discharge: 2022-07-19 | Disposition: A | Discharge status: Home / Self Care | Payer: Medicare HMO | Source: Ambulatory Visit | Attending: Family Medicine | Admitting: Family Medicine

## 2022-07-19 DIAGNOSIS — Z7952 Long term (current) use of systemic steroids: Secondary | ICD-10-CM

## 2022-07-19 DIAGNOSIS — M8580 Other specified disorders of bone density and structure, unspecified site: Secondary | ICD-10-CM

## 2022-07-19 DIAGNOSIS — Z1231 Encounter for screening mammogram for malignant neoplasm of breast: Secondary | ICD-10-CM

## 2022-07-27 DIAGNOSIS — M353 Polymyalgia rheumatica: Secondary | ICD-10-CM | POA: Diagnosis not present

## 2022-07-27 DIAGNOSIS — Z7952 Long term (current) use of systemic steroids: Secondary | ICD-10-CM | POA: Diagnosis not present

## 2022-09-04 DIAGNOSIS — G4733 Obstructive sleep apnea (adult) (pediatric): Secondary | ICD-10-CM | POA: Diagnosis not present

## 2022-09-14 DIAGNOSIS — H40013 Open angle with borderline findings, low risk, bilateral: Secondary | ICD-10-CM | POA: Diagnosis not present

## 2022-09-14 DIAGNOSIS — Z0100 Encounter for examination of eyes and vision without abnormal findings: Secondary | ICD-10-CM | POA: Diagnosis not present

## 2022-10-05 DIAGNOSIS — G4733 Obstructive sleep apnea (adult) (pediatric): Secondary | ICD-10-CM | POA: Diagnosis not present

## 2022-12-04 DIAGNOSIS — G4733 Obstructive sleep apnea (adult) (pediatric): Secondary | ICD-10-CM | POA: Diagnosis not present

## 2023-01-04 ENCOUNTER — Ambulatory Visit
Admission: RE | Admit: 2023-01-04 | Discharge: 2023-01-04 | Disposition: A | Discharge status: Home / Self Care | Payer: Medicare HMO | Source: Ambulatory Visit | Attending: Family Medicine | Admitting: Family Medicine

## 2023-01-04 DIAGNOSIS — R29890 Loss of height: Secondary | ICD-10-CM | POA: Diagnosis not present

## 2023-01-04 DIAGNOSIS — E349 Endocrine disorder, unspecified: Secondary | ICD-10-CM | POA: Diagnosis not present

## 2023-01-04 DIAGNOSIS — M8580 Other specified disorders of bone density and structure, unspecified site: Secondary | ICD-10-CM

## 2023-01-04 DIAGNOSIS — N951 Menopausal and female climacteric states: Secondary | ICD-10-CM | POA: Diagnosis not present

## 2023-01-04 DIAGNOSIS — Z7952 Long term (current) use of systemic steroids: Secondary | ICD-10-CM

## 2023-01-04 DIAGNOSIS — Z8262 Family history of osteoporosis: Secondary | ICD-10-CM | POA: Diagnosis not present

## 2023-01-29 DIAGNOSIS — E039 Hypothyroidism, unspecified: Secondary | ICD-10-CM | POA: Diagnosis not present

## 2023-01-29 DIAGNOSIS — M8589 Other specified disorders of bone density and structure, multiple sites: Secondary | ICD-10-CM | POA: Diagnosis not present

## 2023-01-29 DIAGNOSIS — M353 Polymyalgia rheumatica: Secondary | ICD-10-CM | POA: Diagnosis not present

## 2023-01-29 DIAGNOSIS — R7303 Prediabetes: Secondary | ICD-10-CM | POA: Diagnosis not present

## 2023-01-29 DIAGNOSIS — I10 Essential (primary) hypertension: Secondary | ICD-10-CM | POA: Diagnosis not present

## 2023-01-29 DIAGNOSIS — E785 Hyperlipidemia, unspecified: Secondary | ICD-10-CM | POA: Diagnosis not present

## 2023-03-05 DIAGNOSIS — G4733 Obstructive sleep apnea (adult) (pediatric): Secondary | ICD-10-CM | POA: Diagnosis not present

## 2023-05-21 DIAGNOSIS — S838X2A Sprain of other specified parts of left knee, initial encounter: Secondary | ICD-10-CM | POA: Diagnosis not present

## 2023-06-03 DIAGNOSIS — G4733 Obstructive sleep apnea (adult) (pediatric): Secondary | ICD-10-CM | POA: Diagnosis not present

## 2023-06-06 ENCOUNTER — Other Ambulatory Visit: Payer: Self-pay | Admitting: Family Medicine

## 2023-06-06 DIAGNOSIS — Z1231 Encounter for screening mammogram for malignant neoplasm of breast: Secondary | ICD-10-CM

## 2023-07-23 ENCOUNTER — Ambulatory Visit
Admission: RE | Admit: 2023-07-23 | Discharge: 2023-07-23 | Disposition: A | Discharge status: Home / Self Care | Payer: Medicare HMO | Source: Ambulatory Visit | Attending: Family Medicine | Admitting: Family Medicine

## 2023-07-23 DIAGNOSIS — Z1231 Encounter for screening mammogram for malignant neoplasm of breast: Secondary | ICD-10-CM

## 2023-08-23 DIAGNOSIS — M353 Polymyalgia rheumatica: Secondary | ICD-10-CM | POA: Diagnosis not present

## 2023-08-23 DIAGNOSIS — E669 Obesity, unspecified: Secondary | ICD-10-CM | POA: Diagnosis not present

## 2023-08-23 DIAGNOSIS — Z6836 Body mass index (BMI) 36.0-36.9, adult: Secondary | ICD-10-CM | POA: Diagnosis not present

## 2023-08-23 DIAGNOSIS — M2560 Stiffness of unspecified joint, not elsewhere classified: Secondary | ICD-10-CM | POA: Diagnosis not present

## 2023-08-23 DIAGNOSIS — Z79899 Other long term (current) drug therapy: Secondary | ICD-10-CM | POA: Diagnosis not present

## 2023-08-27 DIAGNOSIS — M25562 Pain in left knee: Secondary | ICD-10-CM | POA: Diagnosis not present

## 2023-09-01 DIAGNOSIS — G4733 Obstructive sleep apnea (adult) (pediatric): Secondary | ICD-10-CM | POA: Diagnosis not present

## 2023-09-15 DIAGNOSIS — M25562 Pain in left knee: Secondary | ICD-10-CM | POA: Diagnosis not present

## 2023-09-17 DIAGNOSIS — H40013 Open angle with borderline findings, low risk, bilateral: Secondary | ICD-10-CM | POA: Diagnosis not present

## 2023-09-21 DIAGNOSIS — M25562 Pain in left knee: Secondary | ICD-10-CM | POA: Diagnosis not present

## 2023-10-11 DIAGNOSIS — G4733 Obstructive sleep apnea (adult) (pediatric): Secondary | ICD-10-CM | POA: Diagnosis not present

## 2023-10-30 DIAGNOSIS — Z1211 Encounter for screening for malignant neoplasm of colon: Secondary | ICD-10-CM | POA: Diagnosis not present

## 2023-11-21 DIAGNOSIS — R195 Other fecal abnormalities: Secondary | ICD-10-CM | POA: Diagnosis not present

## 2023-11-22 DIAGNOSIS — E669 Obesity, unspecified: Secondary | ICD-10-CM | POA: Diagnosis not present

## 2023-11-22 DIAGNOSIS — M353 Polymyalgia rheumatica: Secondary | ICD-10-CM | POA: Diagnosis not present

## 2023-11-22 DIAGNOSIS — Z6837 Body mass index (BMI) 37.0-37.9, adult: Secondary | ICD-10-CM | POA: Diagnosis not present

## 2023-11-22 DIAGNOSIS — M2560 Stiffness of unspecified joint, not elsewhere classified: Secondary | ICD-10-CM | POA: Diagnosis not present

## 2023-11-22 DIAGNOSIS — Z79899 Other long term (current) drug therapy: Secondary | ICD-10-CM | POA: Diagnosis not present

## 2023-11-30 DIAGNOSIS — G4733 Obstructive sleep apnea (adult) (pediatric): Secondary | ICD-10-CM | POA: Diagnosis not present

## 2023-12-20 DIAGNOSIS — E039 Hypothyroidism, unspecified: Secondary | ICD-10-CM | POA: Diagnosis not present

## 2023-12-21 DIAGNOSIS — I10 Essential (primary) hypertension: Secondary | ICD-10-CM | POA: Diagnosis not present

## 2023-12-21 DIAGNOSIS — E1169 Type 2 diabetes mellitus with other specified complication: Secondary | ICD-10-CM | POA: Diagnosis not present

## 2023-12-21 DIAGNOSIS — E039 Hypothyroidism, unspecified: Secondary | ICD-10-CM | POA: Diagnosis not present

## 2023-12-27 DIAGNOSIS — K635 Polyp of colon: Secondary | ICD-10-CM | POA: Diagnosis not present

## 2023-12-27 DIAGNOSIS — R195 Other fecal abnormalities: Secondary | ICD-10-CM | POA: Diagnosis not present

## 2023-12-27 DIAGNOSIS — Z1211 Encounter for screening for malignant neoplasm of colon: Secondary | ICD-10-CM | POA: Diagnosis not present

## 2023-12-31 DIAGNOSIS — K635 Polyp of colon: Secondary | ICD-10-CM | POA: Diagnosis not present

## 2024-01-23 DIAGNOSIS — I10 Essential (primary) hypertension: Secondary | ICD-10-CM | POA: Diagnosis not present

## 2024-02-28 DIAGNOSIS — G4733 Obstructive sleep apnea (adult) (pediatric): Secondary | ICD-10-CM | POA: Diagnosis not present

## 2024-03-17 DIAGNOSIS — H40013 Open angle with borderline findings, low risk, bilateral: Secondary | ICD-10-CM | POA: Diagnosis not present

## 2024-05-22 DIAGNOSIS — M2560 Stiffness of unspecified joint, not elsewhere classified: Secondary | ICD-10-CM | POA: Diagnosis not present

## 2024-05-22 DIAGNOSIS — Z79899 Other long term (current) drug therapy: Secondary | ICD-10-CM | POA: Diagnosis not present

## 2024-05-22 DIAGNOSIS — Z6835 Body mass index (BMI) 35.0-35.9, adult: Secondary | ICD-10-CM | POA: Diagnosis not present

## 2024-05-22 DIAGNOSIS — E669 Obesity, unspecified: Secondary | ICD-10-CM | POA: Diagnosis not present

## 2024-05-22 DIAGNOSIS — M353 Polymyalgia rheumatica: Secondary | ICD-10-CM | POA: Diagnosis not present

## 2024-05-28 DIAGNOSIS — G4733 Obstructive sleep apnea (adult) (pediatric): Secondary | ICD-10-CM | POA: Diagnosis not present

## 2024-06-26 ENCOUNTER — Other Ambulatory Visit: Payer: Self-pay | Admitting: Family Medicine

## 2024-06-26 DIAGNOSIS — Z1231 Encounter for screening mammogram for malignant neoplasm of breast: Secondary | ICD-10-CM

## 2024-08-21 ENCOUNTER — Ambulatory Visit
Admission: RE | Admit: 2024-08-21 | Discharge: 2024-08-21 | Disposition: A | Discharge status: Home / Self Care | Source: Ambulatory Visit | Attending: Family Medicine | Admitting: Family Medicine

## 2024-08-21 DIAGNOSIS — Z1231 Encounter for screening mammogram for malignant neoplasm of breast: Secondary | ICD-10-CM
